# Patient Record
Sex: Female | Born: 1980 | Race: White | Hispanic: No | Marital: Married | Smoking: Never smoker
Health system: Southern US, Community
[De-identification: ages and names within clinical notes are randomized; demographics above are authoritative.]

## PROBLEM LIST (undated history)

## (undated) ENCOUNTER — Inpatient Hospital Stay (HOSPITAL_COMMUNITY): Payer: Self-pay

## (undated) DIAGNOSIS — N72 Inflammatory disease of cervix uteri: Secondary | ICD-10-CM

## (undated) DIAGNOSIS — K589 Irritable bowel syndrome without diarrhea: Secondary | ICD-10-CM

## (undated) DIAGNOSIS — N760 Acute vaginitis: Secondary | ICD-10-CM

## (undated) DIAGNOSIS — R1032 Left lower quadrant pain: Secondary | ICD-10-CM

## (undated) DIAGNOSIS — Z975 Presence of (intrauterine) contraceptive device: Secondary | ICD-10-CM

## (undated) DIAGNOSIS — I459 Conduction disorder, unspecified: Secondary | ICD-10-CM

## (undated) DIAGNOSIS — N926 Irregular menstruation, unspecified: Secondary | ICD-10-CM

## (undated) DIAGNOSIS — B9689 Other specified bacterial agents as the cause of diseases classified elsewhere: Secondary | ICD-10-CM

## (undated) DIAGNOSIS — F99 Mental disorder, not otherwise specified: Secondary | ICD-10-CM

## (undated) DIAGNOSIS — F419 Anxiety disorder, unspecified: Secondary | ICD-10-CM

## (undated) DIAGNOSIS — G43909 Migraine, unspecified, not intractable, without status migrainosus: Secondary | ICD-10-CM

## (undated) DIAGNOSIS — J329 Chronic sinusitis, unspecified: Secondary | ICD-10-CM

## (undated) DIAGNOSIS — N898 Other specified noninflammatory disorders of vagina: Secondary | ICD-10-CM

## (undated) DIAGNOSIS — IMO0002 Reserved for concepts with insufficient information to code with codable children: Secondary | ICD-10-CM

## (undated) HISTORY — DX: Mental disorder, not otherwise specified: F99

## (undated) HISTORY — DX: Left lower quadrant pain: R10.32

## (undated) HISTORY — DX: Anxiety disorder, unspecified: F41.9

## (undated) HISTORY — DX: Other specified noninflammatory disorders of vagina: N89.8

## (undated) HISTORY — DX: Presence of (intrauterine) contraceptive device: Z97.5

## (undated) HISTORY — DX: Other specified bacterial agents as the cause of diseases classified elsewhere: B96.89

## (undated) HISTORY — DX: Irregular menstruation, unspecified: N92.6

## (undated) HISTORY — DX: Acute vaginitis: N76.0

## (undated) HISTORY — DX: Chronic sinusitis, unspecified: J32.9

## (undated) HISTORY — DX: Inflammatory disease of cervix uteri: N72

## (undated) HISTORY — DX: Irritable bowel syndrome, unspecified: K58.9

## (undated) HISTORY — DX: Reserved for concepts with insufficient information to code with codable children: IMO0002

---
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
## 2005-09-06 ENCOUNTER — Other Ambulatory Visit: Admission: RE | Admit: 2005-09-06 | Discharge: 2005-09-06 | Payer: Self-pay | Admitting: Gynecology

## 2005-11-18 ENCOUNTER — Other Ambulatory Visit: Admission: RE | Admit: 2005-11-18 | Discharge: 2005-11-18 | Payer: Self-pay | Admitting: Gynecology

## 2006-07-06 ENCOUNTER — Other Ambulatory Visit: Admission: RE | Admit: 2006-07-06 | Discharge: 2006-07-06 | Payer: Self-pay | Admitting: Gynecology

## 2007-03-07 ENCOUNTER — Other Ambulatory Visit: Admission: RE | Admit: 2007-03-07 | Discharge: 2007-03-07 | Payer: Self-pay | Admitting: Gynecology

## 2007-06-01 ENCOUNTER — Ambulatory Visit (HOSPITAL_COMMUNITY): Admission: RE | Admit: 2007-06-01 | Discharge: 2007-06-01 | Payer: Self-pay | Admitting: Family Medicine

## 2009-03-01 DIAGNOSIS — IMO0002 Reserved for concepts with insufficient information to code with codable children: Secondary | ICD-10-CM

## 2009-03-01 DIAGNOSIS — R87619 Unspecified abnormal cytological findings in specimens from cervix uteri: Secondary | ICD-10-CM

## 2009-03-01 HISTORY — DX: Unspecified abnormal cytological findings in specimens from cervix uteri: R87.619

## 2009-03-01 HISTORY — DX: Reserved for concepts with insufficient information to code with codable children: IMO0002

## 2009-07-02 ENCOUNTER — Other Ambulatory Visit: Admission: RE | Admit: 2009-07-02 | Discharge: 2009-07-02 | Payer: Self-pay | Admitting: Obstetrics and Gynecology

## 2010-05-11 ENCOUNTER — Inpatient Hospital Stay (HOSPITAL_COMMUNITY)
Admission: AD | Admit: 2010-05-11 | Discharge: 2010-05-14 | DRG: 373 | Disposition: A | Discharge status: Home / Self Care | Payer: BC Managed Care – PPO | Source: Ambulatory Visit | Attending: Family Medicine | Admitting: Family Medicine

## 2010-05-11 DIAGNOSIS — O878 Other venous complications in the puerperium: Secondary | ICD-10-CM | POA: Diagnosis present

## 2010-05-11 DIAGNOSIS — K649 Unspecified hemorrhoids: Secondary | ICD-10-CM | POA: Diagnosis present

## 2010-05-12 DIAGNOSIS — O878 Other venous complications in the puerperium: Secondary | ICD-10-CM

## 2010-05-12 DIAGNOSIS — K649 Unspecified hemorrhoids: Secondary | ICD-10-CM

## 2010-05-12 LAB — URINALYSIS, DIPSTICK ONLY
Bilirubin Urine: NEGATIVE
Glucose, UA: NEGATIVE mg/dL
Ketones, ur: 15 mg/dL — AB
Leukocytes, UA: NEGATIVE
Nitrite: NEGATIVE
Protein, ur: NEGATIVE mg/dL
Specific Gravity, Urine: 1.015 (ref 1.005–1.030)
Urobilinogen, UA: 0.2 mg/dL (ref 0.0–1.0)
pH: 7 (ref 5.0–8.0)

## 2010-05-12 LAB — CBC
HCT: 33.8 % — ABNORMAL LOW (ref 36.0–46.0)
Hemoglobin: 10.6 g/dL — ABNORMAL LOW (ref 12.0–15.0)
MCH: 26.1 pg (ref 26.0–34.0)
MCHC: 31.4 g/dL (ref 30.0–36.0)
MCV: 83.3 fL (ref 78.0–100.0)
Platelets: 193 10*3/uL (ref 150–400)
RBC: 4.06 MIL/uL (ref 3.87–5.11)
RDW: 14.9 % (ref 11.5–15.5)
WBC: 11.1 10*3/uL — ABNORMAL HIGH (ref 4.0–10.5)

## 2010-05-12 LAB — COMPREHENSIVE METABOLIC PANEL
ALT: 11 U/L (ref 0–35)
AST: 22 U/L (ref 0–37)
Albumin: 2.4 g/dL — ABNORMAL LOW (ref 3.5–5.2)
Alkaline Phosphatase: 134 U/L — ABNORMAL HIGH (ref 39–117)
BUN: 3 mg/dL — ABNORMAL LOW (ref 6–23)
CO2: 24 mEq/L (ref 19–32)
Calcium: 8.6 mg/dL (ref 8.4–10.5)
Chloride: 107 mEq/L (ref 96–112)
Creatinine, Ser: 0.62 mg/dL (ref 0.4–1.2)
GFR calc Af Amer: >60 mL/min (ref >60)
GFR calc non Af Amer: >60 mL/min (ref >60)
Glucose, Bld: 82 mg/dL (ref 70–99)
Potassium: 3.5 mEq/L (ref 3.5–5.1)
Sodium: 137 mEq/L (ref 135–145)
Total Bilirubin: 0.5 mg/dL (ref 0.3–1.2)
Total Protein: 5.6 g/dL — ABNORMAL LOW (ref 6.0–8.3)

## 2010-05-12 LAB — RPR: RPR Ser Ql: NONREACTIVE

## 2010-05-22 ENCOUNTER — Inpatient Hospital Stay (HOSPITAL_COMMUNITY): Admission: AD | Admit: 2010-05-22 | Payer: Self-pay | Source: Home / Self Care | Admitting: Family Medicine

## 2010-10-12 ENCOUNTER — Other Ambulatory Visit (HOSPITAL_COMMUNITY)
Admission: RE | Admit: 2010-10-12 | Discharge: 2010-10-12 | Disposition: A | Discharge status: Home / Self Care | Payer: BC Managed Care – PPO | Source: Ambulatory Visit | Attending: Obstetrics and Gynecology | Admitting: Obstetrics and Gynecology

## 2010-10-12 DIAGNOSIS — Z01419 Encounter for gynecological examination (general) (routine) without abnormal findings: Secondary | ICD-10-CM | POA: Insufficient documentation

## 2012-02-07 LAB — OB RESULTS CONSOLE RPR: RPR: NONREACTIVE

## 2012-02-07 LAB — OB RESULTS CONSOLE VARICELLA ZOSTER ANTIBODY, IGG: Varicella: IMMUNE

## 2012-02-07 LAB — OB RESULTS CONSOLE ABO/RH

## 2012-02-07 LAB — OB RESULTS CONSOLE ANTIBODY SCREEN: Antibody Screen: NEGATIVE

## 2012-03-01 NOTE — L&D Delivery Note (Signed)
Delivery Note At 2:14 PM a viable and healthy female was delivered via Vaginal, Spontaneous Delivery (Presentation: ; Occiput Posterior).  APGAR: 9, 9; weight pending.   Placenta status: Intact, Spontaneous.  Cord: 3 vessels with the following complications: None.    Anesthesia: Epidural  Episiotomy: None Lacerations: 2nd degree;Perineal Suture Repair: 2.0 and 3.0 vicryl Est. Blood Loss (mL): 300  Mom to postpartum.  Baby to nursery-stable.  Tawni Carnes 09/28/2012, 3:06 PM

## 2012-04-25 ENCOUNTER — Encounter: Payer: Self-pay | Admitting: *Deleted

## 2012-05-16 ENCOUNTER — Encounter: Payer: Self-pay | Admitting: Adult Health

## 2012-05-18 ENCOUNTER — Encounter: Payer: Self-pay | Admitting: Adult Health

## 2012-05-18 ENCOUNTER — Ambulatory Visit (INDEPENDENT_AMBULATORY_CARE_PROVIDER_SITE_OTHER): Payer: BC Managed Care – PPO | Admitting: Adult Health

## 2012-05-18 VITALS — BP 120/80

## 2012-05-18 DIAGNOSIS — J329 Chronic sinusitis, unspecified: Secondary | ICD-10-CM | POA: Insufficient documentation

## 2012-05-18 HISTORY — DX: Chronic sinusitis, unspecified: J32.9

## 2012-05-18 LAB — POCT URINALYSIS DIPSTICK
Bilirubin, UA: NEGATIVE
Glucose, UA: NEGATIVE
Leukocytes, UA: NEGATIVE
Nitrite, UA: NEGATIVE
Urobilinogen, UA: NEGATIVE

## 2012-05-18 MED ORDER — CEPHALEXIN 500 MG PO CAPS: 500.0000 mg | ORAL_CAPSULE | Freq: Four times a day (QID) | ORAL | Status: DC

## 2012-05-18 NOTE — Progress Notes (Signed)
Pt has pressure in head and teeth hurt, has used netty pot.On exam lungs clear +maxillary and ethmoid tenderness. Will prescribe Keflex 500mg  1 daily x 7 days, ok to continue with netty pot, and warm showers.

## 2012-05-18 NOTE — Progress Notes (Signed)
Here today for problems with her sinuses.

## 2012-05-18 NOTE — Patient Instructions (Addendum)
Prescribed keflex 500mg  1 4 x daily x 7 days, ok to continue with netty pot  Follow up as scheduled

## 2012-05-23 ENCOUNTER — Encounter: Payer: Self-pay | Admitting: Advanced Practice Midwife

## 2012-06-08 ENCOUNTER — Ambulatory Visit (INDEPENDENT_AMBULATORY_CARE_PROVIDER_SITE_OTHER): Payer: BC Managed Care – PPO | Admitting: Obstetrics & Gynecology

## 2012-06-08 ENCOUNTER — Encounter: Payer: Self-pay | Admitting: Obstetrics & Gynecology

## 2012-06-08 VITALS — BP 110/70 | Wt 161.0 lb

## 2012-06-08 DIAGNOSIS — Z3482 Encounter for supervision of other normal pregnancy, second trimester: Secondary | ICD-10-CM

## 2012-06-08 DIAGNOSIS — Z3480 Encounter for supervision of other normal pregnancy, unspecified trimester: Secondary | ICD-10-CM

## 2012-06-08 LAB — POCT URINALYSIS DIPSTICK
Ketones, UA: NEGATIVE
Leukocytes, UA: NEGATIVE

## 2012-06-08 NOTE — Patient Instructions (Signed)
Don't forget the sugar test Next time.   Pregnancy - Second Trimester The second trimester of pregnancy (3 to 6 months) is a period of rapid growth for you and your baby. At the end of the sixth month, your baby is about 9 inches long and weighs 1 1/2 pounds. You will begin to feel the baby move between 18 and 20 weeks of the pregnancy. This is called quickening. Weight gain is faster. A clear fluid (colostrum) may leak out of your breasts. You may feel small contractions of the womb (uterus). This is known as false labor or Braxton-Hicks contractions. This is like a practice for labor when the baby is ready to be born. Usually, the problems with morning sickness have usually passed by the end of your first trimester. Some women develop small dark blotches (called cholasma, mask of pregnancy) on their face that usually goes away after the baby is born. Exposure to the sun makes the blotches worse. Acne may also develop in some pregnant women and pregnant women who have acne, may find that it goes away. PRENATAL EXAMS  Blood work may continue to be done during prenatal exams. These tests are done to check on your health and the probable health of your baby. Blood work is used to follow your blood levels (hemoglobin). Anemia (low hemoglobin) is common during pregnancy. Iron and vitamins are given to help prevent this. You will also be checked for diabetes between 24 and 28 weeks of the pregnancy. Some of the previous blood tests may be repeated.  The size of the uterus is measured during each visit. This is to make sure that the baby is continuing to grow properly according to the dates of the pregnancy.  Your blood pressure is checked every prenatal visit. This is to make sure you are not getting toxemia.  Your urine is checked to make sure you do not have an infection, diabetes or protein in the urine.  Your weight is checked often to make sure gains are happening at the suggested rate. This is to  ensure that both you and your baby are growing normally.  Sometimes, an ultrasound is performed to confirm the proper growth and development of the baby. This is a test which bounces harmless sound waves off the baby so your caregiver can more accurately determine due dates. Sometimes, a specialized test is done on the amniotic fluid surrounding the baby. This test is called an amniocentesis. The amniotic fluid is obtained by sticking a needle into the belly (abdomen). This is done to check the chromosomes in instances where there is a concern about possible genetic problems with the baby. It is also sometimes done near the end of pregnancy if an early delivery is required. In this case, it is done to help make sure the baby's lungs are mature enough for the baby to live outside of the womb. CHANGES OCCURING IN THE SECOND TRIMESTER OF PREGNANCY Your body goes through many changes during pregnancy. They vary from person to person. Talk to your caregiver about changes you notice that you are concerned about.  During the second trimester, you will likely have an increase in your appetite. It is normal to have cravings for certain foods. This varies from person to person and pregnancy to pregnancy.  Your lower abdomen will begin to bulge.  You may have to urinate more often because the uterus and baby are pressing on your bladder. It is also common to get more bladder infections during  pregnancy (pain with urination). You can help this by drinking lots of fluids and emptying your bladder before and after intercourse.  You may begin to get stretch marks on your hips, abdomen, and breasts. These are normal changes in the body during pregnancy. There are no exercises or medications to take that prevent this change.  You may begin to develop swollen and bulging veins (varicose veins) in your legs. Wearing support hose, elevating your feet for 15 minutes, 3 to 4 times a day and limiting salt in your diet helps  lessen the problem.  Heartburn may develop as the uterus grows and pushes up against the stomach. Antacids recommended by your caregiver helps with this problem. Also, eating smaller meals 4 to 5 times a day helps.  Constipation can be treated with a stool softener or adding bulk to your diet. Drinking lots of fluids, vegetables, fruits, and whole grains are helpful.  Exercising is also helpful. If you have been very active up until your pregnancy, most of these activities can be continued during your pregnancy. If you have been less active, it is helpful to start an exercise program such as walking.  Hemorrhoids (varicose veins in the rectum) may develop at the end of the second trimester. Warm sitz baths and hemorrhoid cream recommended by your caregiver helps hemorrhoid problems.  Backaches may develop during this time of your pregnancy. Avoid heavy lifting, wear low heal shoes and practice good posture to help with backache problems.  Some pregnant women develop tingling and numbness of their hand and fingers because of swelling and tightening of ligaments in the wrist (carpel tunnel syndrome). This goes away after the baby is born.  As your breasts enlarge, you may have to get a bigger bra. Get a comfortable, cotton, support bra. Do not get a nursing bra until the last month of the pregnancy if you will be nursing the baby.  You may get a dark line from your belly button to the pubic area called the linea nigra.  You may develop rosy cheeks because of increase blood flow to the face.  You may develop spider looking lines of the face, neck, arms and chest. These go away after the baby is born. HOME CARE INSTRUCTIONS   It is extremely important to avoid all smoking, herbs, alcohol, and unprescribed drugs during your pregnancy. These chemicals affect the formation and growth of the baby. Avoid these chemicals throughout the pregnancy to ensure the delivery of a healthy infant.  Most of  your home care instructions are the same as suggested for the first trimester of your pregnancy. Keep your caregiver's appointments. Follow your caregiver's instructions regarding medication use, exercise and diet.  During pregnancy, you are providing food for you and your baby. Continue to eat regular, well-balanced meals. Choose foods such as meat, fish, milk and other low fat dairy products, vegetables, fruits, and whole-grain breads and cereals. Your caregiver will tell you of the ideal weight gain.  A physical sexual relationship may be continued up until near the end of pregnancy if there are no other problems. Problems could include early (premature) leaking of amniotic fluid from the membranes, vaginal bleeding, abdominal pain, or other medical or pregnancy problems.  Exercise regularly if there are no restrictions. Check with your caregiver if you are unsure of the safety of some of your exercises. The greatest weight gain will occur in the last 2 trimesters of pregnancy. Exercise will help you:  Control your weight.  Get you  in shape for labor and delivery.  Lose weight after you have the baby.  Wear a good support or jogging bra for breast tenderness during pregnancy. This may help if worn during sleep. Pads or tissues may be used in the bra if you are leaking colostrum.  Do not use hot tubs, steam rooms or saunas throughout the pregnancy.  Wear your seat belt at all times when driving. This protects you and your baby if you are in an accident.  Avoid raw meat, uncooked cheese, cat litter boxes and soil used by cats. These carry germs that can cause birth defects in the baby.  The second trimester is also a good time to visit your dentist for your dental health if this has not been done yet. Getting your teeth cleaned is OK. Use a soft toothbrush. Brush gently during pregnancy.  It is easier to loose urine during pregnancy. Tightening up and strengthening the pelvic muscles will  help with this problem. Practice stopping your urination while you are going to the bathroom. These are the same muscles you need to strengthen. It is also the muscles you would use as if you were trying to stop from passing gas. You can practice tightening these muscles up 10 times a set and repeating this about 3 times per day. Once you know what muscles to tighten up, do not perform these exercises during urination. It is more likely to contribute to an infection by backing up the urine.  Ask for help if you have financial, counseling or nutritional needs during pregnancy. Your caregiver will be able to offer counseling for these needs as well as refer you for other special needs.  Your skin may become oily. If so, wash your face with mild soap, use non-greasy moisturizer and oil or cream based makeup. MEDICATIONS AND DRUG USE IN PREGNANCY  Take prenatal vitamins as directed. The vitamin should contain 1 milligram of folic acid. Keep all vitamins out of reach of children. Only a couple vitamins or tablets containing iron may be fatal to a baby or young child when ingested.  Avoid use of all medications, including herbs, over-the-counter medications, not prescribed or suggested by your caregiver. Only take over-the-counter or prescription medicines for pain, discomfort, or fever as directed by your caregiver. Do not use aspirin.  Let your caregiver also know about herbs you may be using.  Alcohol is related to a number of birth defects. This includes fetal alcohol syndrome. All alcohol, in any form, should be avoided completely. Smoking will cause low birth rate and premature babies.  Street or illegal drugs are very harmful to the baby. They are absolutely forbidden. A baby born to an addicted mother will be addicted at birth. The baby will go through the same withdrawal an adult does. SEEK MEDICAL CARE IF:  You have any concerns or worries during your pregnancy. It is better to call with your  questions if you feel they cannot wait, rather than worry about them. SEEK IMMEDIATE MEDICAL CARE IF:   An unexplained oral temperature above 102 F (38.9 C) develops, or as your caregiver suggests.  You have leaking of fluid from the vagina (birth canal). If leaking membranes are suspected, take your temperature and tell your caregiver of this when you call.  There is vaginal spotting, bleeding, or passing clots. Tell your caregiver of the amount and how many pads are used. Light spotting in pregnancy is common, especially following intercourse.  You develop a bad smelling vaginal  discharge with a change in the color from clear to white.  You continue to feel sick to your stomach (nauseated) and have no relief from remedies suggested. You vomit blood or coffee ground-like materials.  You lose more than 2 pounds of weight or gain more than 2 pounds of weight over 1 week, or as suggested by your caregiver.  You notice swelling of your face, hands, feet, or legs.  You get exposed to [place] measles and have never had them.  You are exposed to fifth disease or chickenpox.  You develop belly (abdominal) pain. Round ligament discomfort is a common non-cancerous (benign) cause of abdominal pain in pregnancy. Your caregiver still must evaluate you.  You develop a bad headache that does not go away.  You develop fever, diarrhea, pain with urination, or shortness of breath.  You develop visual problems, blurry, or double vision.  You fall or are in a car accident or any kind of trauma.  There is mental or physical violence at home. Document Released: 02/09/2001 Document Revised: 05/10/2011 Document Reviewed: 08/14/2008 [facility] Patient Information 2013 [place], [place].

## 2012-06-08 NOTE — Progress Notes (Signed)
BP weight and urine results all reviewed and noted. Patient reports good fetal movement, denies any bleeding and no rupture of membranes symptoms or regular contractions. Patient is without complaints. All questions were answered.  

## 2012-06-19 ENCOUNTER — Telehealth: Payer: Self-pay | Admitting: Obstetrics and Gynecology

## 2012-06-19 NOTE — Telephone Encounter (Signed)
Spoke with pt. Was having Braxton Hicks contractions on Friday, Sat, and Sunday. Notices it more when laying down. No bleeding. +baby movement. Advised to keep a close check on it. If they come again and are timeable, push fluids, lay down, and call us if it's during office hours. If it's after office hours, call the after hour nurse line. Next scheduled appt here is May 4. Pt is a runner, so I advised to lay off on running for a few days. Pt voiced understanding.

## 2012-07-06 ENCOUNTER — Other Ambulatory Visit: Payer: BC Managed Care – PPO

## 2012-07-06 ENCOUNTER — Encounter: Payer: Self-pay | Admitting: Advanced Practice Midwife

## 2012-07-06 ENCOUNTER — Ambulatory Visit (INDEPENDENT_AMBULATORY_CARE_PROVIDER_SITE_OTHER): Payer: BC Managed Care – PPO | Admitting: Advanced Practice Midwife

## 2012-07-06 VITALS — BP 120/72 | Wt 167.5 lb

## 2012-07-06 DIAGNOSIS — Z3482 Encounter for supervision of other normal pregnancy, second trimester: Secondary | ICD-10-CM

## 2012-07-06 DIAGNOSIS — Z3480 Encounter for supervision of other normal pregnancy, unspecified trimester: Secondary | ICD-10-CM

## 2012-07-06 LAB — CBC
HCT: 37.4 % (ref 36.0–46.0)
Hemoglobin: 12.6 g/dL (ref 12.0–15.0)
MCHC: 33.7 g/dL (ref 30.0–36.0)
MCV: 90.1 fL (ref 78.0–100.0)
WBC: 7.9 10*3/uL (ref 4.0–10.5)

## 2012-07-06 LAB — POCT URINALYSIS DIPSTICK: Protein, UA: NEGATIVE

## 2012-07-06 LAB — RPR

## 2012-07-06 NOTE — Progress Notes (Signed)
Braxton Hicks contractions; pressure, baby seems low.

## 2012-07-06 NOTE — Assessment & Plan Note (Signed)
Clinic:[facility]  Genetic Screen NT:                                        Quad Screen/MSAFP:  Anatomic US   Glucose Screen   GBS   Feeding Preference   Contraception   Circumcision     

## 2012-07-06 NOTE — Patient Instructions (Signed)
Braxton Hicks Contractions  Pregnancy is commonly associated with contractions of the uterus throughout the pregnancy. Towards the end of pregnancy (32 to 34 weeks), these contractions (Braxton Hicks) can develop more often and may become more forceful. This is not true labor because these contractions do not result in opening (dilatation) and thinning of the cervix. They are sometimes difficult to tell apart from true labor because these contractions can be forceful and people have different pain tolerances. You should not feel embarrassed if you go to the hospital with false labor. Sometimes, the only way to tell if you are in true labor is for your caregiver to follow the changes in the cervix.  How to tell the difference between true and false labor:  · False labor.  · The contractions of false labor are usually shorter, irregular and not as hard as those of true labor.  · They are often felt in the front of the lower abdomen and in the groin.  · They may leave with walking around or changing positions while lying down.  · They get weaker and are shorter lasting as time goes on.  · These contractions are usually irregular.  · They do not usually become progressively stronger, regular and closer together as with true labor.  · True labor.  · Contractions in true labor last 30 to 70 seconds, become very regular, usually become more intense, and increase in frequency.  · They do not go away with walking.  · The discomfort is usually felt in the top of the uterus and spreads to the lower abdomen and low back.  · True labor can be determined by your caregiver with an exam. This will show that the cervix is dilating and getting thinner.  If there are no prenatal problems or other health problems associated with the pregnancy, it is completely safe to be sent home with false labor and await the onset of true labor.  HOME CARE INSTRUCTIONS   · Keep up with your usual exercises and instructions.  · Take medications as  directed.  · Keep your regular prenatal appointment.  · Eat and drink lightly if you think you are going into labor.  · If BH contractions are making you uncomfortable:  · Change your activity position from lying down or resting to walking/walking to resting.  · Sit and rest in a tub of warm water.  · Drink 2 to 3 glasses of water. Dehydration may cause B-H contractions.  · Do slow and deep breathing several times an hour.  SEEK IMMEDIATE MEDICAL CARE IF:   · Your contractions continue to become stronger, more regular, and closer together.  · You have a gushing, burst or leaking of fluid from the vagina.  · An oral temperature above 102° F (38.9° C) develops.  · You have passage of blood-tinged mucus.  · You develop vaginal bleeding.  · You develop continuous belly (abdominal) pain.  · You have low back pain that you never had before.  · You feel the baby's head pushing down causing pelvic pressure.  · The baby is not moving as much as it used to.  Document Released: 02/15/2005 Document Revised: 05/10/2011 Document Reviewed: 08/09/2008  [employer]® Patient Information ©2013 [employer].

## 2012-07-06 NOTE — Progress Notes (Signed)
Here for PN2;   Having "a lot of braxton hicks "  For about 2-3 weeks.  Epidsodes last 2-3 hours, and they are "back to back" and feels like baby is very low.   No concerns with vaginal exam.  No fetal parts in pelvis.  fFn collected.  Routine questions about pregnancy answered.  F/U in 4 weeks for LROB.

## 2012-07-07 LAB — GLUCOSE TOLERANCE, 2 HOURS W/ 1HR
Glucose, 2 hour: 105 mg/dL (ref 70–139)
Glucose, Fasting: 69 mg/dL — ABNORMAL LOW (ref 70–99)

## 2012-07-07 LAB — HSV 2 ANTIBODY, IGG: HSV 2 Glycoprotein G Ab, IgG: <0.1 IV

## 2012-07-07 LAB — ANTIBODY SCREEN: Antibody Screen: NEGATIVE

## 2012-07-17 ENCOUNTER — Telehealth: Payer: Self-pay | Admitting: *Deleted

## 2012-07-18 ENCOUNTER — Telehealth: Payer: Self-pay | Admitting: Obstetrics and Gynecology

## 2012-07-18 NOTE — Telephone Encounter (Signed)
Pt informed of WNL Glucose results and Neg- fetal fibronectin, pt states having Braxton Hicks contractions, pt encouraged to push fluids and rest and can take tylenol anytime during pregnancy, pt verbalized understanding.

## 2012-08-03 ENCOUNTER — Ambulatory Visit (INDEPENDENT_AMBULATORY_CARE_PROVIDER_SITE_OTHER): Payer: BC Managed Care – PPO | Admitting: Obstetrics & Gynecology

## 2012-08-03 ENCOUNTER — Encounter: Payer: Self-pay | Admitting: Obstetrics & Gynecology

## 2012-08-03 VITALS — BP 110/60 | Wt 171.0 lb

## 2012-08-03 DIAGNOSIS — Z331 Pregnant state, incidental: Secondary | ICD-10-CM

## 2012-08-03 DIAGNOSIS — Z1389 Encounter for screening for other disorder: Secondary | ICD-10-CM

## 2012-08-03 DIAGNOSIS — Z3480 Encounter for supervision of other normal pregnancy, unspecified trimester: Secondary | ICD-10-CM

## 2012-08-03 LAB — POCT URINALYSIS DIPSTICK
Ketones, UA: NEGATIVE
Protein, UA: NEGATIVE

## 2012-08-03 NOTE — Progress Notes (Signed)
BP weight and urine results all reviewed and noted. Patient reports good fetal movement, denies any bleeding and no rupture of membranes symptoms or regular contractions. Patient is without complaints. All questions were answered.  

## 2012-08-14 NOTE — Telephone Encounter (Signed)
Done

## 2012-08-17 ENCOUNTER — Ambulatory Visit (INDEPENDENT_AMBULATORY_CARE_PROVIDER_SITE_OTHER): Payer: BC Managed Care – PPO | Admitting: Advanced Practice Midwife

## 2012-08-17 ENCOUNTER — Encounter: Payer: Self-pay | Admitting: Advanced Practice Midwife

## 2012-08-17 VITALS — BP 120/80 | Wt 171.6 lb

## 2012-08-17 DIAGNOSIS — Z331 Pregnant state, incidental: Secondary | ICD-10-CM

## 2012-08-17 DIAGNOSIS — Z1389 Encounter for screening for other disorder: Secondary | ICD-10-CM

## 2012-08-17 DIAGNOSIS — Z3480 Encounter for supervision of other normal pregnancy, unspecified trimester: Secondary | ICD-10-CM

## 2012-08-17 LAB — POCT URINALYSIS DIPSTICK: Ketones, UA: NEGATIVE

## 2012-08-17 NOTE — Progress Notes (Signed)
No c/o at this time.out of work [employer]) until January.  Routine questions about pregnancy answered.  F/U in 2 weeks for LROB.

## 2012-08-30 ENCOUNTER — Encounter: Payer: Self-pay | Admitting: Obstetrics & Gynecology

## 2012-08-30 ENCOUNTER — Ambulatory Visit (INDEPENDENT_AMBULATORY_CARE_PROVIDER_SITE_OTHER): Payer: BC Managed Care – PPO | Admitting: Obstetrics & Gynecology

## 2012-08-30 VITALS — BP 120/80 | Wt 176.0 lb

## 2012-08-30 DIAGNOSIS — Z1389 Encounter for screening for other disorder: Secondary | ICD-10-CM

## 2012-08-30 DIAGNOSIS — Z3483 Encounter for supervision of other normal pregnancy, third trimester: Secondary | ICD-10-CM

## 2012-08-30 DIAGNOSIS — Z331 Pregnant state, incidental: Secondary | ICD-10-CM

## 2012-08-30 DIAGNOSIS — Z3480 Encounter for supervision of other normal pregnancy, unspecified trimester: Secondary | ICD-10-CM

## 2012-08-30 LAB — POCT URINALYSIS DIPSTICK
Glucose, UA: NEGATIVE
Nitrite, UA: NEGATIVE

## 2012-08-30 NOTE — Progress Notes (Signed)
BP weight and urine results all reviewed and noted. Patient reports good fetal movement, denies any bleeding and no rupture of membranes symptoms or regular contractions. Patient is without complaints. All questions were answered. Taking zantac prn wants to continue otc

## 2012-08-30 NOTE — Patient Instructions (Signed)

## 2012-09-15 ENCOUNTER — Ambulatory Visit (INDEPENDENT_AMBULATORY_CARE_PROVIDER_SITE_OTHER): Payer: BC Managed Care – PPO | Admitting: Obstetrics & Gynecology

## 2012-09-15 ENCOUNTER — Encounter: Payer: Self-pay | Admitting: Obstetrics & Gynecology

## 2012-09-15 VITALS — BP 120/80 | Wt 177.0 lb

## 2012-09-15 DIAGNOSIS — Z331 Pregnant state, incidental: Secondary | ICD-10-CM

## 2012-09-15 DIAGNOSIS — Z3483 Encounter for supervision of other normal pregnancy, third trimester: Secondary | ICD-10-CM

## 2012-09-15 DIAGNOSIS — Z1389 Encounter for screening for other disorder: Secondary | ICD-10-CM

## 2012-09-15 DIAGNOSIS — Z3480 Encounter for supervision of other normal pregnancy, unspecified trimester: Secondary | ICD-10-CM

## 2012-09-15 LAB — POCT URINALYSIS DIPSTICK
Glucose, UA: NEGATIVE
Nitrite, UA: NEGATIVE

## 2012-09-15 NOTE — Progress Notes (Signed)
FOR GBS/GC/CHL TODAY. 

## 2012-09-15 NOTE — Patient Instructions (Signed)
Epidural Risks and Benefits The continuous putting in (infusion) of local anesthetics through a long, narrow, hollow plastic tube (catheter)/needle into the lower (lumbar) area of your spine is commonly called an epidural. This means outside the covering of the spinal cord. The epidural catheter is placed in the space on the outside of the membrane that covers the spinal cord. The anesthetic medicine numbs the nerves of the spinal cord in the epidural space. There is also a spinal/epidural anesthetic using two needles and a catheter. The medication is first placed in the spinal canal. Then that needle is removed and a catheter is placed in the epidural space through the second needle for continuous anesthesia. This seems to be the most popular type of regional anesthesia used now. This is sometimes given for pain management to women who are giving birth. Spinal and epidural anesthesia are called regional anesthesia because they numb a certain region of the body. While it is an effective pain management tool, some reasons not to use this include:  Restricted mobility: The tubes and monitors connected to you do not allow for much moving around.  Increased likelihood of bladder catheterization, oxytocin administration, and internal monitoring. This means a tube (catheter) may have to be put into the bladder to drain the urine. Uterine contractions can become weaker and less frequent. They also may have a higher use of oxytocin than mothers not having regional anesthesia.  Increased likelihood of operative delivery: This includes the use of or need for forceps, vacuum extractor, episiotomy, or cesarean delivery. When the dose is too large, or when it sinks down into the "tailbone" (sacral) region of the body, the perineum and the birth canal (vagina) are anesthetized. Anesthetic is injected into this area late in labor to deaden all sensation. When it "accidentally" happens earlier in labor, the muscles of the  pelvic floor are relaxed too early. This interferes with the normal flexion and rotation of the baby's head as it passes through the birth canal. This interference can lead to abnormal presentations that are more dangerous for the baby.  Must use an automatic blood pressure cuff throughout labor. This is a cuff that automatically takes your blood pressure at regular intervals. SHORT TERM MATERNAL RISKS  Dural puncture - The dura is one of the membranes surrounding the spinal cord. If the anesthetic medication gets into the spinal canal through a dural puncture, it can result in a spinal anesthetic and spinal headache. Spinal headaches are treated with an epidural blood patch to cover the punctured area.  Low blood pressure (hypotension) - Nearly one third of women with an epidural will develop low blood pressure. The ways that patients must lay during the epidural can make this worse. Their position is limited because they will be unable to move their legs easily for the time of the anesthetic. Low blood pressure is also a risk for the baby. If the baby does not get enough oxygen from the mom's blood, it can result in an emergency Cesarean section. This means the baby is delivered by an operation through a cut by the surgeon (incision) on the belly of the mother.  Nausea, vomiting, and prolonged shivering.  Prolonged labor - With large doses of anesthetic medication, the patient loses the desire and the ability to bear down and push. This results in an increased use of forceps and vacuum extractions, compared to women having unmedicated deliveries.  Uneven, incomplete or non-existent pain relief. Sometimes the epidural does not work well and   additional medications may be needed for pain relief.  Difficulty breathing well or paralysis if the level of anesthesia goes too high in the spine.  Convulsions - If the anesthetic agent accidentally is injected into a blood vessel it can cause convulsions and  loss of consciousness.  Toxic drug reactions.  Septic meningitis - An abscess can form at the site where the epidural catheter is placed. If this spreads into the spinal canal it can cause meningitis.  Allergic reaction - This causes blood pressure to become too low and other medications and fluids must be given to bring the blood pressure up. Also rashes and difficulty breathing may develop.  Cardiac arrest - This is rare but real threat to the life of the mother and baby.  Fever is common.  Itching that is easily treated.  Spinal hematoma. LONG TERM MATERNAL RISKS  Neurological complications - A nerve problem called Horner's syndrome can develop with epidural anesthesia for vaginal delivery. It is impossible to predict which patients will develop a Horner's syndrome. Even the nerves to the face can be blocked, temporarily or permanently. Tremors and shakes can occur.  Paresthesia ("pins and needles"). This is a feeling that comes from inflammation of a nerve.  Dizziness and fainting can become a problem after epidurals. This is usually only for a couple of days. RISKS TO BABY  Direct drug toxicity.  Fetal distress, abnormal fetal heart rate (FHR) (can lead to emergency cesarean). This is especially true if the anesthetic gets into the mother's blood stream or too much medication is put into the epidural. REASONS NOT TO HAVE EPIDURAL ANESTHESIA  Increased costs.  The mother has a low blood pressure.  There are blood clotting problems.  A brain tumor is present.  There is an infection in the blood stream.  A skin infection at the needle site.  A tattoo at the needle site. BENEFITS  Regional anesthesia is the most effective pain relief for labor and delivery.  It is the best anesthetic for preeclampsia and eclampsia.  There is better pain control after delivery (vaginal or cesarean).  When done correctly, no medication gets to the baby.  Sooner ambulation after  delivery.  It can be left in place during all of labor.  You can be awake during a Cesarean delivery and see the baby immediately after delivery. AFTER THE PROCEDURE   You will be kept in bed for several hours to prevent headaches.  You will be kept in bed until your legs are no longer numb and it is safe to walk.  The length of time you spend in the hospital will depend on the type of surgery or procedure you have had.  The epidural catheter is removed after you no longer need it for pain. HOME CARE INSTRUCTIONS   Do not drive or operate any kind of machinery for at least 24 hours. Make sure there is someone to drive you home.  Do not drink alcohol for at least 24 hours after the anesthesia.  Do not make important decisions for at least 24 hours after the anesthesia.  Drink lots of fluids.  Return to your normal diet.  Keep all your postoperative appointments as scheduled. SEEK IMMEDIATE MEDICAL CARE IF:  You develop a fever or temperature over 98.6 F (37 C).  You have a persistent headache.  You develop dizziness, fainting or lightheadedness.  You develop weakness, numbness or tingling in your arms or legs.  You have a skin rash.  You   have difficulty breathing  You have a stiff neck with or without stiff back.  You develop chest pain. Document Released: 02/15/2005 Document Revised: 05/10/2011 Document Reviewed: 03/25/2008 Exit[employer] Patient Information 2014 [employer].  

## 2012-09-15 NOTE — Progress Notes (Signed)
Very low. BP weight and urine results all reviewed and noted. Patient reports good fetal movement, denies any bleeding and no rupture of membranes symptoms or regular contractions. Patient is without complaints. All questions were answered.

## 2012-09-16 LAB — GC/CHLAMYDIA PROBE AMP: CT Probe RNA: NEGATIVE

## 2012-09-17 LAB — STREP B DNA PROBE: GBSP: NEGATIVE

## 2012-09-21 ENCOUNTER — Encounter: Payer: Self-pay | Admitting: Advanced Practice Midwife

## 2012-09-21 ENCOUNTER — Ambulatory Visit (INDEPENDENT_AMBULATORY_CARE_PROVIDER_SITE_OTHER): Payer: BC Managed Care – PPO | Admitting: Advanced Practice Midwife

## 2012-09-21 VITALS — BP 128/70 | Wt 178.0 lb

## 2012-09-21 DIAGNOSIS — Z3480 Encounter for supervision of other normal pregnancy, unspecified trimester: Secondary | ICD-10-CM

## 2012-09-21 DIAGNOSIS — Z3483 Encounter for supervision of other normal pregnancy, third trimester: Secondary | ICD-10-CM

## 2012-09-21 DIAGNOSIS — Z331 Pregnant state, incidental: Secondary | ICD-10-CM

## 2012-09-21 DIAGNOSIS — Z1389 Encounter for screening for other disorder: Secondary | ICD-10-CM

## 2012-09-21 LAB — POCT URINALYSIS DIPSTICK
Blood, UA: NEGATIVE
Protein, UA: NEGATIVE

## 2012-09-21 NOTE — Progress Notes (Signed)
Having "menstrual" cramps.  Cervix very posterior, vertex low. Routine questions about pregnancy answered.  F/U in 1 weeks for LROB.Marland Kitchen

## 2012-09-28 ENCOUNTER — Inpatient Hospital Stay (HOSPITAL_COMMUNITY)
Admission: AD | Admit: 2012-09-28 | Discharge: 2012-09-30 | DRG: 373 | Disposition: A | Discharge status: Home / Self Care | Payer: BC Managed Care – PPO | Source: Ambulatory Visit | Attending: Obstetrics & Gynecology | Admitting: Obstetrics & Gynecology

## 2012-09-28 ENCOUNTER — Encounter (HOSPITAL_COMMUNITY): Payer: Self-pay | Admitting: Anesthesiology

## 2012-09-28 ENCOUNTER — Inpatient Hospital Stay (HOSPITAL_COMMUNITY): Payer: BC Managed Care – PPO | Admitting: Anesthesiology

## 2012-09-28 ENCOUNTER — Encounter: Payer: BC Managed Care – PPO | Admitting: Women's Health

## 2012-09-28 ENCOUNTER — Encounter (HOSPITAL_COMMUNITY): Payer: Self-pay | Admitting: *Deleted

## 2012-09-28 DIAGNOSIS — Z3483 Encounter for supervision of other normal pregnancy, third trimester: Secondary | ICD-10-CM

## 2012-09-28 LAB — CBC
HCT: 35 % — ABNORMAL LOW (ref 36.0–46.0)
MCV: 84.3 fL (ref 78.0–100.0)
RBC: 4.15 MIL/uL (ref 3.87–5.11)
RDW: 13 % (ref 11.5–15.5)
WBC: 9.9 10*3/uL (ref 4.0–10.5)

## 2012-09-28 LAB — TYPE AND SCREEN

## 2012-09-28 LAB — ABO/RH: ABO/RH(D): A POS

## 2012-09-28 LAB — RPR: RPR Ser Ql: NONREACTIVE

## 2012-09-28 MED ORDER — OXYCODONE-ACETAMINOPHEN 5-325 MG PO TABS: 1.0000 | ORAL_TABLET | ORAL | Status: DC | PRN

## 2012-09-28 MED ORDER — DIPHENHYDRAMINE HCL 25 MG PO CAPS: 25.0000 mg | ORAL_CAPSULE | Freq: Four times a day (QID) | ORAL | Status: DC | PRN

## 2012-09-28 MED ORDER — LACTATED RINGERS IV SOLN: 500.0000 mL | Freq: Once | INTRAVENOUS | Status: DC

## 2012-09-28 MED ORDER — EPHEDRINE 5 MG/ML INJ
10.0000 mg | INTRAVENOUS | Status: DC | PRN
  Filled 2012-09-28: qty 4

## 2012-09-28 MED ORDER — NALBUPHINE SYRINGE 5 MG/0.5 ML: 10.0000 mg | INJECTION | INTRAMUSCULAR | Status: DC | PRN

## 2012-09-28 MED ORDER — LACTATED RINGERS IV SOLN: 500.0000 mL | INTRAVENOUS | Status: DC | PRN

## 2012-09-28 MED ORDER — ONDANSETRON HCL 4 MG/2ML IJ SOLN: 4.0000 mg | Freq: Four times a day (QID) | INTRAMUSCULAR | Status: DC | PRN

## 2012-09-28 MED ORDER — LIDOCAINE HCL (PF) 1 % IJ SOLN
INTRAMUSCULAR | Status: DC | PRN
  Administered 2012-09-28 (×2): 5 mL

## 2012-09-28 MED ORDER — PHENYLEPHRINE 40 MCG/ML (10ML) SYRINGE FOR IV PUSH (FOR BLOOD PRESSURE SUPPORT): 80.0000 ug | PREFILLED_SYRINGE | INTRAVENOUS | Status: DC | PRN

## 2012-09-28 MED ORDER — OXYTOCIN 40 UNITS IN LACTATED RINGERS INFUSION - SIMPLE MED
62.5000 mL/h | INTRAVENOUS | Status: DC
  Filled 2012-09-28: qty 1000

## 2012-09-28 MED ORDER — DIPHENHYDRAMINE HCL 50 MG/ML IJ SOLN
12.5000 mg | INTRAMUSCULAR | Status: DC | PRN
  Filled 2012-09-28: qty 1

## 2012-09-28 MED ORDER — SIMETHICONE 80 MG PO CHEW: 80.0000 mg | CHEWABLE_TABLET | ORAL | Status: DC | PRN

## 2012-09-28 MED ORDER — OXYTOCIN BOLUS FROM INFUSION: 500.0000 mL | INTRAVENOUS | Status: DC

## 2012-09-28 MED ORDER — CITRIC ACID-SODIUM CITRATE 334-500 MG/5ML PO SOLN: 30.0000 mL | ORAL | Status: DC | PRN

## 2012-09-28 MED ORDER — LACTATED RINGERS IV SOLN
INTRAVENOUS | Status: DC
  Administered 2012-09-28 (×2): via INTRAVENOUS

## 2012-09-28 MED ORDER — DIBUCAINE 1 % RE OINT
1.0000 "application " | TOPICAL_OINTMENT | RECTAL | Status: DC | PRN
  Filled 2012-09-28: qty 28

## 2012-09-28 MED ORDER — ONDANSETRON HCL 4 MG PO TABS: 4.0000 mg | ORAL_TABLET | ORAL | Status: DC | PRN

## 2012-09-28 MED ORDER — IBUPROFEN 600 MG PO TABS: 600.0000 mg | ORAL_TABLET | Freq: Four times a day (QID) | ORAL | Status: DC | PRN

## 2012-09-28 MED ORDER — BENZOCAINE-MENTHOL 20-0.5 % EX AERO
1.0000 "application " | INHALATION_SPRAY | CUTANEOUS | Status: DC | PRN
  Administered 2012-09-28: 1 via TOPICAL
  Filled 2012-09-28 (×2): qty 56

## 2012-09-28 MED ORDER — WITCH HAZEL-GLYCERIN EX PADS: 1.0000 "application " | MEDICATED_PAD | CUTANEOUS | Status: DC | PRN

## 2012-09-28 MED ORDER — PHENYLEPHRINE 40 MCG/ML (10ML) SYRINGE FOR IV PUSH (FOR BLOOD PRESSURE SUPPORT)
80.0000 ug | PREFILLED_SYRINGE | INTRAVENOUS | Status: DC | PRN
  Filled 2012-09-28: qty 5

## 2012-09-28 MED ORDER — OXYCODONE-ACETAMINOPHEN 5-325 MG PO TABS
1.0000 | ORAL_TABLET | ORAL | Status: DC | PRN
  Administered 2012-09-28 – 2012-09-30 (×6): 1 via ORAL
  Filled 2012-09-28 (×6): qty 1

## 2012-09-28 MED ORDER — ONDANSETRON HCL 4 MG/2ML IJ SOLN: 4.0000 mg | INTRAMUSCULAR | Status: DC | PRN

## 2012-09-28 MED ORDER — ZOLPIDEM TARTRATE 5 MG PO TABS: 5.0000 mg | ORAL_TABLET | Freq: Every evening | ORAL | Status: DC | PRN

## 2012-09-28 MED ORDER — EPHEDRINE 5 MG/ML INJ: 10.0000 mg | INTRAVENOUS | Status: DC | PRN

## 2012-09-28 MED ORDER — LANOLIN HYDROUS EX OINT: TOPICAL_OINTMENT | CUTANEOUS | Status: DC | PRN

## 2012-09-28 MED ORDER — SENNOSIDES-DOCUSATE SODIUM 8.6-50 MG PO TABS
2.0000 | ORAL_TABLET | Freq: Every day | ORAL | Status: DC
  Administered 2012-09-28 – 2012-09-29 (×2): 2 via ORAL

## 2012-09-28 MED ORDER — IBUPROFEN 600 MG PO TABS
600.0000 mg | ORAL_TABLET | Freq: Four times a day (QID) | ORAL | Status: DC
  Administered 2012-09-28 – 2012-09-30 (×8): 600 mg via ORAL
  Filled 2012-09-28 (×8): qty 1

## 2012-09-28 MED ORDER — LIDOCAINE HCL (PF) 1 % IJ SOLN
30.0000 mL | INTRAMUSCULAR | Status: DC | PRN
  Filled 2012-09-28: qty 30

## 2012-09-28 MED ORDER — ACETAMINOPHEN 325 MG PO TABS
650.0000 mg | ORAL_TABLET | ORAL | Status: DC | PRN
  Administered 2012-09-28: 650 mg via ORAL
  Filled 2012-09-28: qty 2

## 2012-09-28 MED ORDER — FENTANYL 2.5 MCG/ML BUPIVACAINE 1/10 % EPIDURAL INFUSION (WH - ANES)
14.0000 mL/h | INTRAMUSCULAR | Status: DC | PRN
  Administered 2012-09-28 (×2): 14 mL/h via EPIDURAL
  Filled 2012-09-28 (×2): qty 125

## 2012-09-28 MED ORDER — PRENATAL MULTIVITAMIN CH
1.0000 | ORAL_TABLET | Freq: Every day | ORAL | Status: DC
  Administered 2012-09-29 – 2012-09-30 (×2): 1 via ORAL
  Filled 2012-09-28: qty 1

## 2012-09-28 MED ORDER — TETANUS-DIPHTH-ACELL PERTUSSIS 5-2.5-18.5 LF-MCG/0.5 IM SUSP
0.5000 mL | Freq: Once | INTRAMUSCULAR | Status: AC
  Administered 2012-09-29: 0.5 mL via INTRAMUSCULAR
  Filled 2012-09-28 (×2): qty 0.5

## 2012-09-28 NOTE — MAU Note (Signed)
C/o strong ucs.

## 2012-09-28 NOTE — Progress Notes (Signed)
Patient ID: Alexandria Vincent, female   DOB: 06/12/1980, 32 y.o.   MRN: [id number]   S:  Pt comfortable with epidural  O:   Filed Vitals:   09/28/12 0432 09/28/12 0437 09/28/12 0459 09/28/12 0529  BP:   112/57 106/59  Pulse: 62 60 55 54  Temp:      TempSrc:      Resp:   18 18  Height:      Weight:      SpO2: 97% 100%      Dilation: 5.5 Effacement (%): 80 Cervical Position: Posterior Station: -2 Presentation: Vertex Exam by:: AL Rinehart RN  FHTs:  125, moderate variability, accels present, occasional decel (after shoulder) TOCO:  q 2-4 min  A/P 32 y.o. G2P1001 at [redacted]w[redacted]d with SOL - progressing without intervention - continue to manage expectantly - Anticipate SVD - FHTs reactive  Napoleon Form, MD

## 2012-09-28 NOTE — H&P (Signed)
Alexandria Vincent is a 32 y.o. female G2P1001 at [redacted]w[redacted]d presenting for contractions starting around 2 pm yesterday (7/30). They were irregular and mild at first becoming more intense later in the evening. No loss of fluid, vaginal bleeding. Baby moving normally.   Pt receives care at [facility]. No complications of this pregnancy or previous. Normal 2 hour GTT, declined genetic screening, normal ultrasounds. Normal first pregnancy and delivery.   Maternal Medical History:  Reason for admission: Contractions.   Contractions: Onset was 6-12 hours ago.    Fetal activity: Perceived fetal activity is normal.   Last perceived fetal movement was within the past hour.    Prenatal complications: no prenatal complications Prenatal Complications - Diabetes: none.    OB History   Grav Para Term Preterm Abortions TAB SAB Ect Mult Living   2 1 1       1      Past Medical History  Diagnosis Date  . Anxiety   . IBS (irritable bowel syndrome)   . Abnormal Pap smear 2011    asc hpv  . Sinus infection 05/18/2012   Past Surgical History  Procedure Laterality Date  . No past surgeries     Family History: family history includes Cancer in her maternal grandfather and Diabetes in her maternal grandfather. Social History:  reports that she has never smoked. She does not have any smokeless tobacco history on file. She reports that  drinks alcohol. She reports that she does not use illicit drugs.   Prenatal Transfer Tool  Maternal Diabetes: No Genetic Screening: Declined Maternal Ultrasounds/Referrals: Normal Fetal Ultrasounds or other Referrals:  None Maternal Substance Abuse:  No Significant Maternal Medications:  None Significant Maternal Lab Results:  Lab values include: Group B Strep negative Other Comments:  None  ROS  See HPI  Dilation: 4.5 Effacement (%): 80 Station: -1 Blood pressure 140/96, temperature 98.1 F (36.7 C), temperature source Oral, resp. rate 20, height 5\' 6"  (1.676 m),  weight 80.287 kg (177 lb), last menstrual period 12/21/2011, SpO2 100.00%. Maternal Exam:  Abdomen: Patient reports no abdominal tenderness. Introitus: Normal vulva. Normal vagina.  Pelvis: adequate for delivery.   Cervix: Cervix evaluated by digital exam.     Fetal Exam Fetal Monitor Review: Mode: ultrasound.   Baseline rate: 125.  Variability: moderate (6-25 bpm).   Pattern: accelerations present and no decelerations.    Fetal State Assessment: Category I - tracings are normal.     Physical Exam   GEN:  WNWD, no distress HEENT:  NCAT, EOMI, conjunctiva clear NECK:  Supple, non-tender, no thyromegaly, trachea midline CV: RRR, no murmur RESP:  CTAB ABD:  Soft, non-tender, no guarding or rebound, normal bowel sounds EXTREM:  Warm, well perfused, no edema or tenderness NEURO:  Alert, oriented, no focal deficits   Prenatal labs: ABO, Rh: A/Positive/-- (12/09 0000) Antibody: NEG (05/08 0921) Rubella: Immune (12/09 0000) RPR: NON REAC (05/08 0921)  HBsAg: Negative (12/09 0000)  HIV: NON REACTIVE (05/08 0921)  GBS: NEGATIVE (07/18 1141)   Assessment/Plan: 32 y.o. G2P1001 at [redacted]w[redacted]d with SOL - Admit to L&D - FHTs reactive - GBS negative - Epidural when desired - Anticipate SVD   Napoleon Form 09/28/2012, 2:54 AM

## 2012-09-28 NOTE — Anesthesia Procedure Notes (Signed)
Epidural Patient location during procedure: OB Start time: 09/28/2012 3:31 AM  Staffing Anesthesiologist: Angus Seller., Harrell Gave. Performed by: anesthesiologist   Preanesthetic Checklist Completed: patient identified, site marked, surgical consent, pre-op evaluation, timeout performed, IV checked, risks and benefits discussed and monitors and equipment checked  Epidural Patient position: sitting Prep: site prepped and draped and DuraPrep Patient monitoring: continuous pulse ox and blood pressure Approach: midline Injection technique: LOR air and LOR saline  Needle:  Needle type: Tuohy  Needle gauge: 17 G Needle length: 9 cm and 9 Needle insertion depth: 5 cm cm Catheter type: closed end flexible Catheter size: 19 Gauge Catheter at skin depth: 10 cm Test dose: negative  Assessment Events: blood not aspirated, injection not painful, no injection resistance, negative IV test and no paresthesia  Additional Notes Patient identified.  Risk benefits discussed including failed block, incomplete pain control, headache, nerve damage, paralysis, blood pressure changes, nausea, vomiting, reactions to medication both toxic or allergic, and postpartum back pain.  Patient expressed understanding and wished to proceed.  All questions were answered.  Sterile technique used throughout procedure and epidural site dressed with sterile barrier dressing. No paresthesia or other complications noted.The patient did not experience any signs of intravascular injection such as tinnitus or metallic taste in mouth nor signs of intrathecal spread such as rapid motor block. Please see nursing notes for vital signs.

## 2012-09-28 NOTE — Anesthesia Preprocedure Evaluation (Signed)
Anesthesia Evaluation  Patient identified by MRN, date of birth, ID band Patient awake    Reviewed: Allergy & Precautions, H&P , Patient's Chart, lab work & pertinent test results  Airway Mallampati: II TM Distance: >3 FB Neck ROM: full    Dental no notable dental hx.    Pulmonary neg pulmonary ROS,  breath sounds clear to auscultation  Pulmonary exam normal       Cardiovascular negative cardio ROS  Rhythm:regular Rate:Normal     Neuro/Psych PSYCHIATRIC DISORDERS negative neurological ROS  negative psych ROS   GI/Hepatic negative GI ROS, Neg liver ROS,   Endo/Other  negative endocrine ROS  Renal/GU negative Renal ROS     Musculoskeletal   Abdominal   Peds  Hematology negative hematology ROS (+)   Anesthesia Other Findings   Reproductive/Obstetrics (+) Pregnancy                           Anesthesia Physical Anesthesia Plan  ASA: II  Anesthesia Plan: Epidural   Post-op Pain Management:    Induction:   Airway Management Planned:   Additional Equipment:   Intra-op Plan:   Post-operative Plan:   Informed Consent: I have reviewed the patients History and Physical, chart, labs and discussed the procedure including the risks, benefits and alternatives for the proposed anesthesia with the patient or authorized representative who has indicated his/her understanding and acceptance.     Plan Discussed with:   Anesthesia Plan Comments:         Anesthesia Quick Evaluation

## 2012-09-28 NOTE — Progress Notes (Signed)
Alexandria Vincent is a 32 y.o. G2P1001 at [redacted]w[redacted]d admitted for onset of labor.  Subjective: Patient comfortable with epidural.   Objective: BP 124/70  Pulse 71  Temp(Src) 97.9 F (36.6 C) (Axillary)  Resp 18  Ht 5\' 10"  (1.778 m)  Wt 80.287 kg (177 lb)  BMI 25.4 kg/m2  SpO2 100%  LMP 12/21/2011      FHT:  FHR: 140 bpm, variability: moderate,  accelerations:  Present,  decelerations:  Absent UC:   regular, every 3 minutes SVE:   Dilation: 9 Effacement (%): 90 Station: -1 Exam by:: Dr. Ike Bene  Labs: Lab Results  Component Value Date   WBC 9.9 09/28/2012   HGB 11.5* 09/28/2012   HCT 35.0* 09/28/2012   MCV 84.3 09/28/2012   PLT 189 09/28/2012    Assessment / Plan: Spontaneous labor, progressing normally AROM at this time 11:40  Labor: Progressing normally Fetal Wellbeing:  Category I Pain Control:  Epidural I/D:  n/a Anticipated MOD:  NSVD  Tawni Carnes 09/28/2012, 11:48 AM

## 2012-09-28 NOTE — Progress Notes (Signed)
Ramiah Helfrich is a 32 y.o. G2P1001 at [redacted]w[redacted]d admitted for active labor  Subjective: Patient feeling the urge to push.  Objective: BP 116/61  Pulse 68  Temp(Src) 98.1 F (36.7 C) (Oral)  Resp 18  Ht 5\' 10"  (1.778 m)  Wt 80.287 kg (177 lb)  BMI 25.4 kg/m2  SpO2 100%  LMP 12/21/2011      FHT:  FHR: 110 bpm, variability: moderate,  accelerations:  Present,  decelerations:  Present deep variables, slow return UC:   regular, every 2 minutes SVE:   Dilation: 10 Effacement (%): 100 Station: +1;+2 Exam by:: Isac Sarna, RN  Labs: Lab Results  Component Value Date   WBC 9.9 09/28/2012   HGB 11.5* 09/28/2012   HCT 35.0* 09/28/2012   MCV 84.3 09/28/2012   PLT 189 09/28/2012    Assessment / Plan: Spontaneous labor, progressing normally Complete at 1341 Active second stage pushing Encourage patient pushing due to FHT Cat II  Labor: second stage Fetal Wellbeing:  Category II Pain Control:  Epidural I/D:  n/a Anticipated MOD:  NSVD  Tawni Carnes 09/28/2012, 2:03 PM

## 2012-09-29 NOTE — Lactation Note (Signed)
This note was copied from the chart of Alexandria Julianah Marciel. Lactation Consultation Note Baby now 20 hours old; asleep; family member holding baby, mom resting in bed. Mom states that baby has been sleepy since birth, that he had a meal in the middle of the night, and that she has attempted 2 times since then but baby has been sleeping.  Enc mom to continue frequent STS and cue based feeding; to continue trying to wake baby for feeding; discussed baby signs of stress and how long to attempt feeding before allowing baby to rest. Enc mom to call for assistance if she has any concerns.   Patient Name: Alexandria Vincent HQION'G Date: 09/29/2012 Reason for consult: Follow-up assessment   Maternal Data Has patient been taught Hand Expression?: Yes  Feeding Feeding Type: Breast Milk Length of feed: 0 min  LATCH Score/Interventions                      Lactation Tools Discussed/Used     Consult Status Consult Status: Follow-up Follow-up type: In-patient    Octavio Manns [facility] 09/29/2012, 11:24 AM

## 2012-09-29 NOTE — Progress Notes (Addendum)
Post Partum Day 1 Subjective: no complaints, up ad lib, voiding, tolerating PO and + flatus. Patient reports bleeding has not decreased. Decided to use Mirena for birth control at next personal provider appointment.  Is undecided whether she wants to go home this afternoon.  WIll let us know. Objective: Blood pressure 109/52, pulse 73, temperature 97.8 F (36.6 C), temperature source Oral, resp. rate 16, height 5\' 10"  (1.778 m), weight 80.287 kg (177 lb), last menstrual period 12/21/2011, SpO2 100.00%, unknown if currently breastfeeding.  Physical Exam:  General: alert, cooperative and no distress, pleasant Lochia:  Uterine Fundus: firm at umbilicus Incision: no incision DVT Evaluation: No evidence of DVT seen on physical exam. Negative Homan's sign. No cords or calf tenderness. No significant calf/ankle edema.   Recent Labs  09/28/12 0240  HGB 11.5*  HCT 35.0*    Assessment/Plan:  Possilbe Discharge home, Breastfeeding, Circumcision prior to discharge and Contraception Mirena to be inserted at next post-natal visit.   LOS: 1 day   Garnette Czech 09/29/2012, 7:57 AM   I have seen and examined this patient and agree the above assessment. CRESENZO-DISHMAN,Pasqual Farias 09/29/2012 8:06 AM

## 2012-09-29 NOTE — Anesthesia Postprocedure Evaluation (Signed)
Anesthesia Post Note  Patient: Alexandria Vincent  Procedure(s) Performed: * No procedures listed *  Anesthesia type: Epidural  Patient location: Mother/Baby  Post pain: Pain level controlled  Post assessment: Post-op Vital signs reviewed  Last Vitals:  Filed Vitals:   09/29/12 0630  BP: 109/52  Pulse: 73  Temp: 36.6 C  Resp: 16    Post vital signs: Reviewed  Level of consciousness: awake  Complications: No apparent anesthesia complications

## 2012-09-30 MED ORDER — IBUPROFEN 600 MG PO TABS: 600.0000 mg | ORAL_TABLET | Freq: Four times a day (QID) | ORAL | Status: DC

## 2012-09-30 NOTE — Discharge Summary (Signed)
Obstetric Discharge Summary Reason for Admission: onset of labor Prenatal Procedures: none Intrapartum Procedures: spontaneous vaginal delivery Postpartum Procedures: none Complications-Operative and Postpartum: none Hemoglobin  Date Value Range Status  09/28/2012 11.5* 12.0 - 15.0 g/dL Final     HCT  Date Value Range Status  09/28/2012 35.0* 36.0 - 46.0 % Final   Pt came in for onset of labor. Had an normal course and SVD of newborn female. Post partum care uncomplicated.  She is breast feeding and desires mirena for contraception  Physical Exam:  General: alert, cooperative and no distress Lochia: appropriate Uterine Fundus: firm Incision: na DVT Evaluation: No evidence of DVT seen on physical exam.  Discharge Diagnoses: Term Pregnancy-delivered  Discharge Information: Date: 09/30/2012 Activity: pelvic rest Diet: routine Medications: PNV, Ibuprofen and Colace Condition: stable Instructions: refer to practice specific booklet Discharge to: home Follow-up Information   Follow up with [facility] In 6 weeks.   Contact information:   [address] [place] [address] [phone]      Newborn Data: Live born female  Birth Weight: 7 lb 12.9 oz (3541 g) APGAR: 9, 9  Home with mother.  Alexandria Vincent L 09/30/2012, 10:06 AM

## 2012-09-30 NOTE — Lactation Note (Signed)
This note was copied from the chart of Alexandria Vincent. Lactation Consultation Note  Patient Name: Alexandria Vincent [id number] Date: 09/30/2012 Reason for consult: Follow-up assessment   Maternal Data    Feeding   LATCH Score/Interventions       Type of Nipple: Everted at rest and after stimulation  Comfort (Breast/Nipple): Filling, red/small blisters or bruises, mild/mod discomfort  Problem noted: Mild/Moderate discomfort Interventions (Mild/moderate discomfort): Comfort gels        Lactation Tools Discussed/Used     Consult Status Consult Status: Complete  Mom reports that baby cluster fed through the night and her nipples are sore. Left nipple slightly pink but intact- baby sleeping against right breast. Comfort gels given with instructions for use. Reviewed wide open mouth and keeping the baby close to the breast throughout the feeding. Suggested changing positions also. Asking about taking a break from nursing if the nipples get too sore. Has DEBP at home. Encouraged to be consistent with pumping to prevent engorgement and promote a good milk supply if she decides to do that. No further questions at present. To call prn  Pamelia Hoit 09/30/2012, 8:28 AM

## 2012-10-03 ENCOUNTER — Telehealth: Payer: Self-pay | Admitting: Adult Health

## 2012-10-03 NOTE — Telephone Encounter (Signed)
Spoke with pt. Delivered last Thursday. Having BMs daily, but its hard. + breastfeeding. Spoke with JAG. Advised Senokot. If this don't help, call us back. Pt voiced understanding. JSY

## 2012-10-09 ENCOUNTER — Telehealth: Payer: Self-pay | Admitting: Obstetrics & Gynecology

## 2012-10-09 ENCOUNTER — Ambulatory Visit (INDEPENDENT_AMBULATORY_CARE_PROVIDER_SITE_OTHER): Payer: BC Managed Care – PPO | Admitting: Women's Health

## 2012-10-09 ENCOUNTER — Encounter: Payer: Self-pay | Admitting: Women's Health

## 2012-10-09 VITALS — BP 110/70 | Ht 71.0 in | Wt 165.0 lb

## 2012-10-09 DIAGNOSIS — K5900 Constipation, unspecified: Secondary | ICD-10-CM

## 2012-10-09 NOTE — Patient Instructions (Signed)
Constipation  Drink plenty of fluid, preferably water, throughout the day  Eat foods high in fiber such as fruits, vegetables, and grains  Exercise, such as walking, is a good way to keep your bowels regular  Drink warm fluids, especially warm prune juice, or decaf coffee  Eat a 1/2 cup of real oatmeal (not instant), 1/2 cup applesauce, and 1/2-1 cup warm prune juice every day  If needed, you may take Colace (docusate sodium) stool softener once or twice a day to help keep the stool soft. If you are pregnant, wait until you are out of your first trimester (12-14 weeks of pregnancy)  If you still are having problems with constipation, you may take Miralax once daily as needed to help keep your bowels regular.  If you are pregnant, wait until you are out of your first trimester (12-14 weeks of pregnancy)   Constipation, Adult Constipation is when a person has fewer than 3 bowel movements a week; has difficulty having a bowel movement; or has stools that are dry, hard, or larger than normal. As people grow older, constipation is more common. If you try to fix constipation with medicines that make you have a bowel movement (laxatives), the problem may get worse. Long-term laxative use may cause the muscles of the colon to become weak. A low-fiber diet, not taking in enough fluids, and taking certain medicines may make constipation worse. CAUSES   Certain medicines, such as antidepressants, pain medicine, iron supplements, antacids, and water pills.   Certain diseases, such as diabetes, irritable bowel syndrome (IBS), thyroid disease, or depression.   Not drinking enough water.   Not eating enough fiber-rich foods.   Stress or travel.  Lack of physical activity or exercise.  Not going to the restroom when there is the urge to have a bowel movement.  Ignoring the urge to have a bowel movement.  Using laxatives too much. SYMPTOMS   Having fewer than 3 bowel movements a week.    Straining to have a bowel movement.   Having hard, dry, or larger than normal stools.   Feeling full or bloated.   Pain in the lower abdomen.  Not feeling relief after having a bowel movement. DIAGNOSIS  Your caregiver will take a medical history and perform a physical exam. Further testing may be done for severe constipation. Some tests may include:   A barium enema X-ray to examine your rectum, colon, and sometimes, your small intestine.  A sigmoidoscopy to examine your lower colon.  A colonoscopy to examine your entire colon. TREATMENT  Treatment will depend on the severity of your constipation and what is causing it. Some dietary treatments include drinking more fluids and eating more fiber-rich foods. Lifestyle treatments may include regular exercise. If these diet and lifestyle recommendations do not help, your caregiver may recommend taking over-the-counter laxative medicines to help you have bowel movements. Prescription medicines may be prescribed if over-the-counter medicines do not work.  HOME CARE INSTRUCTIONS   Increase dietary fiber in your diet, such as fruits, vegetables, whole grains, and beans. Limit high-fat and processed sugars in your diet, such as [place] fries, hamburgers, cookies, candies, and soda.   A fiber supplement may be added to your diet if you cannot get enough fiber from foods.   Drink enough fluids to keep your urine clear or pale yellow.   Exercise regularly or as directed by your caregiver.   Go to the restroom when you have the urge to go. Do not hold  it.  Only take medicines as directed by your caregiver. Do not take other medicines for constipation without talking to your caregiver first. SEEK IMMEDIATE MEDICAL CARE IF:   You have bright red blood in your stool.   Your constipation lasts for more than 4 days or gets worse.   You have abdominal or rectal pain.   You have thin, pencil-like stools.  You have unexplained  weight loss. MAKE SURE YOU:   Understand these instructions.  Will watch your condition.  Will get help right away if you are not doing well or get worse. Document Released: 11/14/2003 Document Revised: 05/10/2011 Document Reviewed: 01/19/2011 [facility] Patient Information 2014 [place], [place].

## 2012-10-09 NOTE — Progress Notes (Signed)
Patient ID: Alexandria Vincent, female   DOB: December 20, 1980, 32 y.o.   MRN: [id number] [name] is a 32 y.o. G64P2002 female app 1.5wks s/p SVD who presents w/ report of constipation since birth. Only hard balls of stool. Taking colace daily and an OTC stimulant w/o relief.   Recommended:   Drink plenty of fluid, preferably water, throughout the day  Eat foods high in fiber such as fruits, vegetables, and grains  Exercise, such as walking, is a good way to keep your bowels regular  Drink warm fluids, especially warm prune juice, or decaf coffee  Eat a 1/2 cup of real oatmeal (not instant), 1/2 cup applesauce, and 1/2-1 cup warm prune juice every day  Colace (docusate sodium) stool softener wice a day to help keep the stool soft.   Miralax once daily as needed   Fleets enema as needed  Keep pp visit as scheduled Call if above recommendations do not help  Cheral Marker, CNM, [facility] 10/09/2012 1:52 PM

## 2012-10-09 NOTE — Telephone Encounter (Signed)
Spoke with pt. Still having constipation issues. Tried Senokot and that caused stomach cramps. Tried Miralax this am, so far nothing. Appt scheduled for today. JSY

## 2012-10-09 NOTE — Progress Notes (Signed)
Patient ID: Alexandria Vincent, female   DOB: 1980-10-04, 32 y.o.   MRN: [id number] Pt here today for constipation. Nothing seems to help, only causes cramping.

## 2012-11-09 ENCOUNTER — Encounter: Payer: Self-pay | Admitting: Adult Health

## 2012-11-09 ENCOUNTER — Ambulatory Visit (INDEPENDENT_AMBULATORY_CARE_PROVIDER_SITE_OTHER): Payer: BC Managed Care – PPO | Admitting: Adult Health

## 2012-11-09 DIAGNOSIS — Z3200 Encounter for pregnancy test, result unknown: Secondary | ICD-10-CM

## 2012-11-09 DIAGNOSIS — F53 Postpartum depression: Secondary | ICD-10-CM | POA: Insufficient documentation

## 2012-11-09 DIAGNOSIS — Z3202 Encounter for pregnancy test, result negative: Secondary | ICD-10-CM

## 2012-11-09 LAB — POCT URINE PREGNANCY: Preg Test, Ur: NEGATIVE

## 2012-11-09 MED ORDER — ESCITALOPRAM OXALATE 10 MG PO TABS: 10.0000 mg | ORAL_TABLET | Freq: Every day | ORAL | Status: DC

## 2012-11-09 NOTE — Progress Notes (Signed)
Patient ID: Alexandria Vincent, female   DOB: 10-03-1980, 32 y.o.   MRN: [id number] Alexandria Vincent is a 32 year old white female married in for her postpartum visit.  Delivery Date:09/28/12  Method of Delivery: Vaginal delivery of baby boy,Aubrey, 7\' 12" . Had 2nd degree tear with repair  Sexual Activity since delivery: No  Method of Feeding: breast feeding x 3 weeks now bottle  Number of weeks bleeding post delivery: 4-5  Review of Systems: Patient denies any headaches, blurred vision, shortness of breath, chest pain, abdominal pain, problems with bowel movements, urination, or intercourse.No joint pains, has moody moments and feels overwhelmed at times.Can be teary too.Not as bad as with first child, but her 37 year old is strong willed.will Rx lexapro, she does not feel counseling is needed but will go if changes.Wants IUD for 5 years.She is a Runner, 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  Reviewed past medical,surgical, social and family history. Reviewed medications and allergies.    Depression Score: 11  BP 110/66  Ht 5\' 11"  (1.803 m)  Wt 160 lb (72.576 kg)  BMI 22.33 kg/m2  Breastfeeding? No Pelvic Exam:   External genitalia is normal in appearance.  The vagina is normal in appearance. The cervix is bulbous.  Uterus is felt to be normal size, shape, and contour, well involuted.  No adnexal masses or tenderness noted.   Impression:  Status post delivery, post partum check, depression screening, contraceptive management. Postpartum depression  Plan:   Take time for self,let some things go, Rx lexapro 10 mg #30 1 daily with 6 refills Call with menses for IUD insertion, no sex til then Follow up in 6 weeks, can call anytime if needs to talk Review handout on postpartum depression

## 2012-11-09 NOTE — Patient Instructions (Addendum)

## 2012-12-04 ENCOUNTER — Ambulatory Visit (INDEPENDENT_AMBULATORY_CARE_PROVIDER_SITE_OTHER): Payer: BC Managed Care – PPO | Admitting: Adult Health

## 2012-12-04 VITALS — BP 120/72 | Ht 70.0 in | Wt 160.0 lb

## 2012-12-04 DIAGNOSIS — Z3200 Encounter for pregnancy test, result unknown: Secondary | ICD-10-CM

## 2012-12-04 DIAGNOSIS — Z3202 Encounter for pregnancy test, result negative: Secondary | ICD-10-CM

## 2012-12-04 LAB — POCT URINE PREGNANCY: Preg Test, Ur: NEGATIVE

## 2012-12-08 ENCOUNTER — Ambulatory Visit: Payer: BC Managed Care – PPO | Admitting: Adult Health

## 2012-12-13 ENCOUNTER — Encounter: Payer: Self-pay | Admitting: Women's Health

## 2012-12-13 ENCOUNTER — Ambulatory Visit (INDEPENDENT_AMBULATORY_CARE_PROVIDER_SITE_OTHER): Payer: BC Managed Care – PPO | Admitting: Women's Health

## 2012-12-13 VITALS — BP 110/68 | Ht 71.0 in | Wt 162.5 lb

## 2012-12-13 DIAGNOSIS — Z30430 Encounter for insertion of intrauterine contraceptive device: Secondary | ICD-10-CM

## 2012-12-13 DIAGNOSIS — Z3202 Encounter for pregnancy test, result negative: Secondary | ICD-10-CM

## 2012-12-13 LAB — POCT URINE PREGNANCY: Preg Test, Ur: NEGATIVE

## 2012-12-13 NOTE — Progress Notes (Signed)
Patient ID: Alexandria Vincent, female   DOB: 1980-08-12, 32 y.o.   MRN: [id number] [name] is a 32 y.o. year old G63P2002 Caucasian female ~8wks s/p SVD, who presents for placement of a Mirena IUD. Bottlefeeding. Was placed on lexapro 10mg  daily by JAG at pp visit, pt states she took 1 or 2, but can't remember to take them and feels like she's doing fine w/o them.  No SI/HI/II.  Has gained 2lbs and is upset about that, requests ideas to help lose weight. Recommended exercise and calorie control.  She thinks her last pap was 54yrs ago and was normal.   Patient's last menstrual period was 12/06/2012. Last sexual intercourse was 4wks ago, and pregnancy test today was neg  The risks and benefits of the method and placement have been thouroughly reviewed with the patient and all questions were answered.  Specifically the patient is aware of failure rate of 03/998, expulsion of the IUD and of possible perforation.  The patient is aware of irregular bleeding due to the method and understands the incidence of irregular bleeding diminishes with time.  Signed copy of informed consent in chart.   Time out was performed.  A Pederson speculum was placed in the vagina.  The cervix was visualized, prepped using Betadine, and grasped with a single tooth tenaculum. The uterus was found to be neutral and it sounded to 8 cm.  Mirena IUD placed per manufacturer's recommendations.   The strings were trimmed to 3 cm.  Sonogram was performed and the proper placement of the IUD was verified via transvaginal u/s.   The patient was given post procedure instructions, including signs and symptoms of infection and to check for the strings after each menses or each month, and refraining from intercourse or anything in the vagina for 3 days.  She was given a Mirena care card with date Mirena placed, and date Mirena to be removed.  She is scheduled for a return appointment in for pap/physical.    Marge Duncans 12/13/2012 10:04 AM

## 2012-12-13 NOTE — Patient Instructions (Signed)
Intrauterine Device Insertion Care After Refer to this sheet in the next few weeks. These instructions provide you with information on caring for yourself after your procedure. Your caregiver may also give you more specific instructions. Your treatment has been planned according to current medical practices, but problems sometimes occur. Call your caregiver if you have any problems or questions after your procedure. HOME CARE INSTRUCTIONS   Only take over-the-counter or prescription medicines for pain, discomfort, or fever as directed by your caregiver. Do not use aspirin. This may increase bleeding.  Check your IUD to make sure it is in place before you resume sexual activity. You should be able to feel the strings. If you cannot feel the strings, something may be wrong. The IUD may have fallen out of the uterus, or the uterus may have been punctured (perforated) during placement. Also, if the strings are getting longer, it may mean that the IUD is being forced out of the uterus. You no longer have full protection from pregnancy if any of these problems occur.  You may resume sexual intercourse if you are not having problems with the IUD. The IUD is considered immediately effective.  You may resume normal activities.  Keep all follow-up appointments to be sure your IUD has remained in place. After the first exam, yearly exams are advised, unless you cannot feel the strings of your IUD.  Continue to check that the IUD is still in place by feeling for the strings after every menstrual period. SEEK MEDICAL CARE IF:   You have bleeding that is heavier or lasts longer than a normal menstrual cycle.  You have a fever.  You have increasing cramps or abdominal pain not relieved with medicine.  You have abdominal pain that does not seem to be related to the same area of earlier cramping and pain.  You are lightheaded, unusually weak, or faint.  You have abnormal vaginal discharge or  smells.  You have pain during sexual intercourse.  You cannot feel the IUD strings, or the IUD string has gotten longer.  You feel the IUD at the opening of the cervix in the vagina.  You think you are pregnant, or you miss your menstrual period.  The IUD string is hurting your sex partner. Document Released: 10/14/2010 Document Revised: 05/10/2011 Document Reviewed: 10/14/2010 [employer] Patient Information 2014 [employer].  

## 2013-01-08 ENCOUNTER — Telehealth: Payer: Self-pay | Admitting: Obstetrics and Gynecology

## 2013-01-08 NOTE — Telephone Encounter (Signed)
IUD inserted by 1 month ago, started period by 2 weeks. Is this normal. Pt informed can have some irregular bleeding with new contraceptive to continue to monitor if bleeding continues pt to call office back. Pt states can still feel IUD string and u/s done for placement at time of IUD insertion.

## 2013-01-16 ENCOUNTER — Other Ambulatory Visit (HOSPITAL_COMMUNITY)
Admission: RE | Admit: 2013-01-16 | Discharge: 2013-01-16 | Disposition: A | Discharge status: Home / Self Care | Payer: BC Managed Care – PPO | Source: Ambulatory Visit | Attending: Obstetrics and Gynecology | Admitting: Obstetrics and Gynecology

## 2013-01-16 ENCOUNTER — Ambulatory Visit (INDEPENDENT_AMBULATORY_CARE_PROVIDER_SITE_OTHER): Payer: BC Managed Care – PPO | Admitting: Adult Health

## 2013-01-16 ENCOUNTER — Encounter (INDEPENDENT_AMBULATORY_CARE_PROVIDER_SITE_OTHER): Payer: Self-pay

## 2013-01-16 ENCOUNTER — Encounter: Payer: Self-pay | Admitting: Adult Health

## 2013-01-16 VITALS — BP 110/68 | HR 72 | Ht 71.0 in | Wt 164.0 lb

## 2013-01-16 DIAGNOSIS — Z975 Presence of (intrauterine) contraceptive device: Secondary | ICD-10-CM

## 2013-01-16 DIAGNOSIS — Z01419 Encounter for gynecological examination (general) (routine) without abnormal findings: Secondary | ICD-10-CM | POA: Insufficient documentation

## 2013-01-16 HISTORY — DX: Presence of (intrauterine) contraceptive device: Z97.5

## 2013-01-16 NOTE — Progress Notes (Signed)
Patient ID: Alexandria Vincent, female   DOB: 01/21/1981, 32 y.o.   MRN: [id number] History of Present Illness: Alexandria Vincent is a 32 year old white female, married in for a pap and physical.Got IUD in October and has had bleeding since,no pain.   Current Medications, Allergies, Past Medical History, Past Surgical History, Family History and Social History were reviewed in [employer] record.     Review of Systems: Patient denies any headaches, blurred vision, shortness of breath, chest pain, abdominal pain, problems with bowel movements, urination, or intercourse. 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    Physical Exam:BP 110/68  Pulse 72  Ht 5\' 11"  (1.803 m)  Wt 164 lb (74.39 kg)  BMI 22.88 kg/m2  LMP 12/19/2012  Breastfeeding? No General:  Well developed, well nourished, no acute distress Skin:  Warm and dry Neck:  Midline trachea, normal thyroid Lungs; Clear to auscultation bilaterally Breast:  No dominant palpable mass, retraction, or nipple discharge Cardiovascular: Regular rate and rhythm Abdomen:  Soft, non tender, no hepatosplenomegaly Pelvic:  External genitalia is normal in appearance.  The vagina is normal in appearance, has period like blood. The cervix is bulbous, everted at os + IUD strings, pap performed.  Uterus is felt to be normal size, shape, and contour.  No  adnexal masses or tenderness noted. Extremities:  No swelling or varicosities noted Psych:  No mood changes, alert and cooperative, seems happy   Impression: Yearly gyn exam IUD in place    Plan: Physical in 1 year If bleeding persists, call Do core exercises

## 2013-01-16 NOTE — Assessment & Plan Note (Signed)
2014-October inserted

## 2013-01-16 NOTE — Patient Instructions (Signed)
Physical in 1 year Call if bleeding persists

## 2013-02-20 ENCOUNTER — Telehealth: Payer: Self-pay | Admitting: Adult Health

## 2013-02-20 MED ORDER — LORAZEPAM 0.5 MG PO TABS: ORAL_TABLET | ORAL | Status: DC

## 2013-02-20 NOTE — Telephone Encounter (Signed)
Not taking lexapro but will, but wants something that helps quick will rx ativan

## 2013-02-20 NOTE — Telephone Encounter (Signed)
Spoke with pt. Was put on gen Lexapro. She can't remember to take the pill. It was probably 2 weeks ago when she last took the pill. Pt states she needs something "more for the moment". Pt gets overwhelmed, crying frequently. She don't feel depressed and not suicidal. Pt states she will be glad to make an appt but was wondering what you recommended since Christmas was coming up. Please advise. Thanks!!!

## 2013-02-20 NOTE — Telephone Encounter (Signed)
Left message x 1. JSY 

## 2013-04-25 ENCOUNTER — Ambulatory Visit: Payer: BC Managed Care – PPO | Admitting: Women's Health

## 2013-08-01 ENCOUNTER — Encounter: Payer: Self-pay | Admitting: Advanced Practice Midwife

## 2013-08-01 ENCOUNTER — Ambulatory Visit (INDEPENDENT_AMBULATORY_CARE_PROVIDER_SITE_OTHER): Payer: BC Managed Care – PPO | Admitting: Advanced Practice Midwife

## 2013-08-01 VITALS — BP 100/60 | Ht 71.0 in | Wt 153.0 lb

## 2013-08-01 DIAGNOSIS — G43109 Migraine with aura, not intractable, without status migrainosus: Secondary | ICD-10-CM

## 2013-08-01 MED ORDER — SUMATRIPTAN SUCCINATE 100 MG PO TABS: 100.0000 mg | ORAL_TABLET | Freq: Once | ORAL | Status: DC | PRN

## 2013-08-01 NOTE — Patient Instructions (Signed)
[facility] 

## 2013-08-01 NOTE — Progress Notes (Signed)
   [facility] Visit  Patient name: Alexandria Vincent MRN [id number]  Date of birth: 06/07/1980  CC & HPI:  Alexandria Vincent is a 33 y.o. Caucasian female presenting today for migraines.  She has a hx of migraine with aura (vision changes) about once a month.  She got a Mirena IUD 11/14, and has noticed she is now having migraines as often as once a week, several times lasting 2-3 days.   Pertinent History Reviewed:  Medical & Surgical Hx:   Past Medical History  Diagnosis Date  . Anxiety   . IBS (irritable bowel syndrome)   . Abnormal Pap smear 2011    asc hpv  . Sinus infection 05/18/2012  . 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 IUD (intrauterine device) in place 01/16/2013   Past Surgical History  Procedure Laterality Date  . No past surgeries     Medications: Reviewed & Updated - Current outpatient prescriptions:levonorgestrel (MIRENA) 20 MCG/24HR IUD, 1 each by Intrauterine route once., Disp: , Rfl: ;  flintstones complete (FLINTSTONES) 60 MG chewable tablet, Chew 2 tablets by mouth daily. , Disp: , Rfl: ;  SUMAtriptan (IMITREX) 100 MG tablet, Take 1 tablet (100 mg total) by mouth once as needed for migraine or headache. May repeat in 2 hours if headache persists or recurs., Disp: 9 tablet, Rfl: 11  Social History: Reviewed -  reports that she has never smoked. She has never used smokeless tobacco.  Objective Findings:  Vitals: BP 100/60  Ht 5\' 11"  (1.803 m)  Wt 153 lb (69.4 kg)  BMI 21.35 kg/m2  Physical Examination: General appearance - alert, well appearing, and in no distress and oriented to person, place, and time Eyes - denies vision changes Neurological - Denies HA now  No results found for this or any previous visit (from the past 24 hour(s)).   Assessment & Plan:  A:   Migraines with aura, more frequent since Mirena insertion P:  It is quite possible that the IUD is responsible for the increase in frequency of HA.  Because she really wants to try to keep  the IUD, recommend seeing [facility] to see if they can help her w/o removing IUD.  If necessary, will remove  Alexandria Vincent CNM6/04/2013 4:55 PM

## 2013-08-24 ENCOUNTER — Observation Stay (HOSPITAL_COMMUNITY)
Admission: EM | Admit: 2013-08-24 | Discharge: 2013-08-26 | Disposition: A | Discharge status: Home / Self Care | Payer: BC Managed Care – PPO | Attending: General Surgery | Admitting: General Surgery

## 2013-08-24 ENCOUNTER — Encounter (HOSPITAL_COMMUNITY): Payer: Self-pay | Admitting: Emergency Medicine

## 2013-08-24 DIAGNOSIS — Z975 Presence of (intrauterine) contraceptive device: Secondary | ICD-10-CM | POA: Insufficient documentation

## 2013-08-24 DIAGNOSIS — K3580 Unspecified acute appendicitis: Principal | ICD-10-CM | POA: Diagnosis present

## 2013-08-24 DIAGNOSIS — F411 Generalized anxiety disorder: Secondary | ICD-10-CM | POA: Insufficient documentation

## 2013-08-24 DIAGNOSIS — K589 Irritable bowel syndrome without diarrhea: Secondary | ICD-10-CM | POA: Insufficient documentation

## 2013-08-24 DIAGNOSIS — N83209 Unspecified ovarian cyst, unspecified side: Secondary | ICD-10-CM | POA: Insufficient documentation

## 2013-08-24 LAB — COMPREHENSIVE METABOLIC PANEL
ALT: 14 U/L (ref 0–35)
AST: 24 U/L (ref 0–37)
Albumin: 4.3 g/dL (ref 3.5–5.2)
Alkaline Phosphatase: 57 U/L (ref 39–117)
BILIRUBIN TOTAL: 0.5 mg/dL (ref 0.3–1.2)
BUN: 21 mg/dL (ref 6–23)
CHLORIDE: 102 meq/L (ref 96–112)
CO2: 21 meq/L (ref 19–32)
CREATININE: 0.81 mg/dL (ref 0.50–1.10)
Calcium: 9.8 mg/dL (ref 8.4–10.5)
GFR calc Af Amer: >90 mL/min (ref >90)
GLUCOSE: 99 mg/dL (ref 70–99)
Potassium: 4.5 mEq/L (ref 3.7–5.3)
Sodium: 142 mEq/L (ref 137–147)
Total Protein: 7.7 g/dL (ref 6.0–8.3)

## 2013-08-24 LAB — CBC WITH DIFFERENTIAL/PLATELET
BASOS ABS: 0 10*3/uL (ref 0.0–0.1)
Basophils Relative: 0 % (ref 0–1)
Eosinophils Absolute: 0.1 10*3/uL (ref 0.0–0.7)
Eosinophils Relative: 1 % (ref 0–5)
HEMATOCRIT: 37.1 % (ref 36.0–46.0)
HEMOGLOBIN: 12.2 g/dL (ref 12.0–15.0)
LYMPHS ABS: 2.2 10*3/uL (ref 0.7–4.0)
LYMPHS PCT: 24 % (ref 12–46)
MCH: 28.6 pg (ref 26.0–34.0)
MCHC: 32.9 g/dL (ref 30.0–36.0)
MCV: 86.9 fL (ref 78.0–100.0)
MONO ABS: 0.6 10*3/uL (ref 0.1–1.0)
Monocytes Relative: 7 % (ref 3–12)
NEUTROS ABS: 6.1 10*3/uL (ref 1.7–7.7)
Neutrophils Relative %: 68 % (ref 43–77)
Platelets: 266 10*3/uL (ref 150–400)
RBC: 4.27 MIL/uL (ref 3.87–5.11)
RDW: 12 % (ref 11.5–15.5)
WBC: 8.9 10*3/uL (ref 4.0–10.5)

## 2013-08-24 LAB — LIPASE, BLOOD: Lipase: 26 U/L (ref 11–59)

## 2013-08-24 MED ORDER — FENTANYL CITRATE 0.05 MG/ML IJ SOLN
50.0000 ug | Freq: Once | INTRAMUSCULAR | Status: AC
  Administered 2013-08-24: 50 ug via INTRAVENOUS
  Filled 2013-08-24: qty 2

## 2013-08-24 MED ORDER — FENTANYL CITRATE 0.05 MG/ML IJ SOLN
50.0000 ug | Freq: Once | INTRAMUSCULAR | Status: AC
  Administered 2013-08-24: 50 ug via INTRAVENOUS

## 2013-08-24 MED ORDER — ONDANSETRON HCL 4 MG/2ML IJ SOLN
4.0000 mg | Freq: Once | INTRAMUSCULAR | Status: AC
  Administered 2013-08-24: 4 mg via INTRAVENOUS
  Filled 2013-08-24: qty 2

## 2013-08-24 NOTE — ED Notes (Signed)
Dr Pickering at bedside 

## 2013-08-24 NOTE — ED Provider Notes (Signed)
CSN: [id number]     Arrival date & time 08/24/13  2235 History   First MD Initiated Contact with Patient 08/24/13 2327     Chief Complaint  Patient presents with  . Abdominal Pain     (Consider location/radiation/quality/duration/timing/severity/associated sxs/prior Treatment) Patient is a 33 y.o. female presenting with abdominal pain. The history is provided by the patient.  Abdominal Pain Associated symptoms: nausea and vomiting   Associated symptoms: no chest pain, no constipation, no diarrhea and no shortness of breath    patient began with upper abdominal pain around 4 hours ago. Acute onset. She's had nausea with vomiting. She states she felt as if she ate it would get better. No fevers. No diarrhea. No constipation. She has a decreased appetite. She has not had pains like this before. It started after abdomen was down. She denies dysuria. she's had no previous surgery on her abdomen.   Past Medical History  Diagnosis Date  . Anxiety   . IBS (irritable bowel syndrome)   . Abnormal Pap smear 2011    asc hpv  . Sinus infection 05/18/2012  . 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 IUD (intrauterine device) in place 01/16/2013   Past Surgical History  Procedure Laterality Date  . No past surgeries     Family History  Problem Relation Age of Onset  . Cancer Maternal Grandfather     lung, bone  . Diabetes Maternal Grandfather    History  Substance Use Topics  . Smoking status: Never Smoker   . Smokeless tobacco: Never Used  . Alcohol Use: Yes     Comment: 1-2 drinks a week; wine once a week   OB History   Grav Para Term Preterm Abortions TAB SAB Ect Mult Living   2 2 2       2      Review of Systems  Constitutional: Negative for activity change and appetite change.  Eyes: Negative for pain.  Respiratory: Negative for chest tightness and shortness of breath.   Cardiovascular: Negative for chest pain and leg swelling.  Gastrointestinal: Positive for nausea, vomiting and  abdominal pain. Negative for diarrhea and constipation.  Genitourinary: Negative for flank pain.  Musculoskeletal: Negative for back pain and neck stiffness.  Skin: Negative for rash.  Neurological: Negative for weakness, numbness and headaches.  Psychiatric/Behavioral: Negative for behavioral problems.      Allergies  Other  Home Medications   Prior to Admission medications   Medication Sig Start Date End Date Taking? Authorizing Provider  flintstones complete (FLINTSTONES) 60 MG chewable tablet Chew 2 tablets by mouth daily.    Yes Historical Provider, MD  levonorgestrel (MIRENA) 20 MCG/24HR IUD 1 each by Intrauterine route once.   Yes Historical Provider, MD  SUMAtriptan (IMITREX) 100 MG tablet Take 1 tablet (100 mg total) by mouth once as needed for migraine or headache. May repeat in 2 hours if headache persists or recurs. 08/01/13  Yes Jacklyn ShellFrances Cresenzo-Dishmon, CNM   BP 127/71  Pulse 47  Temp(Src) 97.5 F (36.4 C) (Oral)  Resp 16  SpO2 97%  LMP 08/14/2013 Physical Exam  Nursing note and vitals reviewed. Constitutional: She is oriented to person, place, and time. She appears well-developed and well-nourished.  HENT:  Head: Normocephalic and atraumatic.  Eyes: EOM are normal. Pupils are equal, round, and reactive to light.  Neck: Normal range of motion. Neck supple.  Cardiovascular: Normal rate, regular rhythm and normal heart sounds.   No murmur heard. Pulmonary/Chest: Effort normal and breath  sounds normal. No respiratory distress. She has no wheezes. She has no rales.  Abdominal: Soft. Bowel sounds are normal. She exhibits no distension. There is tenderness. There is no rebound and no guarding.  Mild right lower quadrant tenderness without rebound or guarding.  Musculoskeletal: Normal range of motion.  Neurological: She is alert and oriented to person, place, and time. No cranial nerve deficit.  Skin: Skin is warm and dry.  Psychiatric: She has a normal mood and  affect. Her speech is normal.    ED Course  Procedures (including critical care time) Labs Review Labs Reviewed  CBC WITH DIFFERENTIAL  COMPREHENSIVE METABOLIC PANEL  LIPASE, BLOOD  POC URINE PREG, ED    Imaging Review Ct Abdomen Pelvis W Contrast  08/25/2013   CLINICAL DATA:  Upper and lower abdominal pain with tenderness. Nausea and vomiting.  EXAM: CT ABDOMEN AND PELVIS WITH CONTRAST  TECHNIQUE: Multidetector CT imaging of the abdomen and pelvis was performed using the standard protocol following bolus administration of intravenous contrast.  CONTRAST:  100mL OMNIPAQUE IOHEXOL 300 MG/ML  SOLN  COMPARISON:  None.  FINDINGS: BODY WALL: Unremarkable.  LOWER CHEST: Unremarkable.  ABDOMEN/PELVIS:  Liver: There is volume loss and mild hypo enhancement involving the lower, anterior right liver, consistent with scarring. There is associated concavity of the right anterior chest wall which could be developmental or posttraumatic. There is a tiny hypervascular focus in the upper midline liver, subcapsular, which is incidental based on age.  Biliary: Distended gallbladder without evidence of wall thickening or calcified stone.  Pancreas: Unremarkable.  Spleen: Unremarkable.  Adrenals: Unremarkable.  Kidneys and ureters: 3 mm nonobstructive stone in the lower pole right kidney  Bladder: Unremarkable.  Reproductive: 3.3 cm cyst in the left adnexa, likely intra-ovarian and follicular. There is an IUD which is well positioned.  Bowel: The appendix is dilated and thick walled. Outer wall diameter measures 14 mm. There is internal debris and appendicoliths. There is mild periappendiceal stranding. Negative for perforation. No bowel obstruction.  Retroperitoneum: No mass or adenopathy.  Peritoneum: Trace pelvic free fluid  Vascular: No acute abnormality.  OSSEOUS: No acute abnormalities.  Critical Value/emergent results were called by telephone at the time of interpretation on 08/25/2013 at 2:38 AM to Dr. Benjiman CoreNATHAN  PICKERING , who verbally acknowledged these results.  IMPRESSION: 1. Acute, non-perforated appendicitis. 2. Nonobstructive right nephrolithiasis. 3. Additional incidental findings are noted above.   Electronically Signed   By: Tiburcio PeaJonathan  Watts M.D.   On: 08/25/2013 02:41     EKG Interpretation None      MDM   Final diagnoses:  Acute appendicitis, unspecified acute appendicitis type    Patient with acute abdominal pain. CT scan shows appendicitis. Lab work normal. General surgery will see patient and likely admitted for or    Juliet Rudeathan R. Rubin PayorPickering, MD 08/25/13 [id number]

## 2013-08-24 NOTE — ED Notes (Addendum)
Presents with upper quadrant abdominal radiates into mid and lower abdomen and back associated with nausea and vomiting. Pain is worse with standing ans straightening body,  crouched in ball makes pain better. Pain began at 6 pm. Pt is tearful. Abdomen is tender.  Denies diarrhea. Unable to hold food or water down. Pain rated 10/10 and is intermittent

## 2013-08-25 ENCOUNTER — Observation Stay (HOSPITAL_COMMUNITY): Payer: BC Managed Care – PPO | Admitting: Certified Registered Nurse Anesthetist

## 2013-08-25 ENCOUNTER — Emergency Department (HOSPITAL_COMMUNITY): Payer: BC Managed Care – PPO

## 2013-08-25 ENCOUNTER — Encounter (HOSPITAL_COMMUNITY): Payer: BC Managed Care – PPO | Admitting: Certified Registered Nurse Anesthetist

## 2013-08-25 ENCOUNTER — Encounter (HOSPITAL_COMMUNITY)
Admission: EM | Disposition: A | Discharge status: Home / Self Care | Payer: Self-pay | Source: Home / Self Care | Attending: Emergency Medicine

## 2013-08-25 ENCOUNTER — Encounter (HOSPITAL_COMMUNITY): Payer: Self-pay

## 2013-08-25 DIAGNOSIS — R1031 Right lower quadrant pain: Secondary | ICD-10-CM

## 2013-08-25 DIAGNOSIS — K3580 Unspecified acute appendicitis: Secondary | ICD-10-CM

## 2013-08-25 DIAGNOSIS — R112 Nausea with vomiting, unspecified: Secondary | ICD-10-CM

## 2013-08-25 HISTORY — PX: APPENDECTOMY: SHX54

## 2013-08-25 HISTORY — PX: LAPAROSCOPIC APPENDECTOMY: SHX408

## 2013-08-25 LAB — SURGICAL PCR SCREEN
MRSA, PCR: NEGATIVE
Staphylococcus aureus: NEGATIVE

## 2013-08-25 LAB — POC URINE PREG, ED: PREG TEST UR: NEGATIVE

## 2013-08-25 SURGERY — APPENDECTOMY, LAPAROSCOPIC
Anesthesia: General | Site: Abdomen

## 2013-08-25 MED ORDER — LACTATED RINGERS IV SOLN
INTRAVENOUS | Status: DC | PRN
  Administered 2013-08-25 (×2): via INTRAVENOUS

## 2013-08-25 MED ORDER — MEPERIDINE HCL 25 MG/ML IJ SOLN: 6.2500 mg | INTRAMUSCULAR | Status: DC | PRN

## 2013-08-25 MED ORDER — HYDROMORPHONE HCL PF 1 MG/ML IJ SOLN
1.0000 mg | Freq: Once | INTRAMUSCULAR | Status: AC
  Administered 2013-08-25: 1 mg via INTRAVENOUS
  Filled 2013-08-25: qty 1

## 2013-08-25 MED ORDER — SODIUM CHLORIDE 0.9 % IV SOLN
3.0000 g | Freq: Once | INTRAVENOUS | Status: AC
  Administered 2013-08-25: 3 g via INTRAVENOUS
  Filled 2013-08-25: qty 3

## 2013-08-25 MED ORDER — BUPIVACAINE-EPINEPHRINE (PF) 0.25% -1:200000 IJ SOLN
INTRAMUSCULAR | Status: AC
  Filled 2013-08-25: qty 30

## 2013-08-25 MED ORDER — SODIUM CHLORIDE 0.9 % IV SOLN
3.0000 g | Freq: Four times a day (QID) | INTRAVENOUS | Status: DC
  Filled 2013-08-25 (×3): qty 3

## 2013-08-25 MED ORDER — GLYCOPYRROLATE 0.2 MG/ML IJ SOLN
INTRAMUSCULAR | Status: DC | PRN
  Administered 2013-08-25: 0.6 mg via INTRAVENOUS
  Administered 2013-08-25: 0.2 mg via INTRAVENOUS

## 2013-08-25 MED ORDER — PROMETHAZINE HCL 25 MG/ML IJ SOLN
INTRAMUSCULAR | Status: AC
  Administered 2013-08-25: 6.25 mg via INTRAVENOUS
  Filled 2013-08-25: qty 1

## 2013-08-25 MED ORDER — 0.9 % SODIUM CHLORIDE (POUR BTL) OPTIME
TOPICAL | Status: DC | PRN
  Administered 2013-08-25: 1000 mL

## 2013-08-25 MED ORDER — ONDANSETRON HCL 4 MG PO TABS: 4.0000 mg | ORAL_TABLET | Freq: Four times a day (QID) | ORAL | Status: DC | PRN

## 2013-08-25 MED ORDER — SODIUM CHLORIDE 0.9 % IR SOLN
Status: DC | PRN
  Administered 2013-08-25: 1000 mL

## 2013-08-25 MED ORDER — ONDANSETRON HCL 4 MG/2ML IJ SOLN
4.0000 mg | Freq: Once | INTRAMUSCULAR | Status: AC
  Administered 2013-08-25: 4 mg via INTRAVENOUS

## 2013-08-25 MED ORDER — TAMSULOSIN HCL 0.4 MG PO CAPS
0.4000 mg | ORAL_CAPSULE | Freq: Every day | ORAL | Status: DC
  Administered 2013-08-25 – 2013-08-26 (×2): 0.4 mg via ORAL
  Filled 2013-08-25 (×2): qty 1

## 2013-08-25 MED ORDER — SUCCINYLCHOLINE CHLORIDE 20 MG/ML IJ SOLN
INTRAMUSCULAR | Status: AC
  Filled 2013-08-25: qty 1

## 2013-08-25 MED ORDER — HYDROMORPHONE HCL PF 1 MG/ML IJ SOLN
1.0000 mg | Freq: Once | INTRAMUSCULAR | Status: AC
  Administered 2013-08-25: 1 mg via INTRAVENOUS
  Filled 2013-08-25 (×2): qty 1

## 2013-08-25 MED ORDER — IOHEXOL 300 MG/ML  SOLN
100.0000 mL | Freq: Once | INTRAMUSCULAR | Status: AC | PRN
  Administered 2013-08-25: 100 mL via INTRAVENOUS

## 2013-08-25 MED ORDER — DEXAMETHASONE SODIUM PHOSPHATE 4 MG/ML IJ SOLN
INTRAMUSCULAR | Status: DC | PRN
  Administered 2013-08-25: 4 mg via INTRAVENOUS

## 2013-08-25 MED ORDER — OXYCODONE HCL 5 MG PO TABS: 5.0000 mg | ORAL_TABLET | Freq: Once | ORAL | Status: DC | PRN

## 2013-08-25 MED ORDER — LIDOCAINE HCL (CARDIAC) 20 MG/ML IV SOLN
INTRAVENOUS | Status: AC
  Filled 2013-08-25: qty 5

## 2013-08-25 MED ORDER — SUCCINYLCHOLINE CHLORIDE 20 MG/ML IJ SOLN
INTRAMUSCULAR | Status: DC | PRN
  Administered 2013-08-25: 100 mg via INTRAVENOUS

## 2013-08-25 MED ORDER — ROCURONIUM BROMIDE 100 MG/10ML IV SOLN
INTRAVENOUS | Status: DC | PRN
  Administered 2013-08-25 (×3): 5 mg via INTRAVENOUS
  Administered 2013-08-25: 25 mg via INTRAVENOUS

## 2013-08-25 MED ORDER — PROPOFOL 10 MG/ML IV BOLUS
INTRAVENOUS | Status: AC
  Filled 2013-08-25: qty 20

## 2013-08-25 MED ORDER — BUPIVACAINE HCL (PF) 0.25 % IJ SOLN
INTRAMUSCULAR | Status: AC
  Filled 2013-08-25: qty 30

## 2013-08-25 MED ORDER — SODIUM CHLORIDE 0.9 % IV SOLN
3.0000 g | Freq: Four times a day (QID) | INTRAVENOUS | Status: AC
  Administered 2013-08-25: 3 g via INTRAVENOUS
  Filled 2013-08-25: qty 3

## 2013-08-25 MED ORDER — MORPHINE SULFATE 2 MG/ML IJ SOLN: 1.0000 mg | INTRAMUSCULAR | Status: DC | PRN

## 2013-08-25 MED ORDER — ROCURONIUM BROMIDE 100 MG/10ML IV SOLN: INTRAVENOUS | Status: DC | PRN

## 2013-08-25 MED ORDER — ONDANSETRON HCL 4 MG/2ML IJ SOLN: 4.0000 mg | Freq: Four times a day (QID) | INTRAMUSCULAR | Status: DC | PRN

## 2013-08-25 MED ORDER — KCL IN DEXTROSE-NACL 20-5-0.45 MEQ/L-%-% IV SOLN
INTRAVENOUS | Status: DC
  Administered 2013-08-25 – 2013-08-26 (×3): via INTRAVENOUS
  Filled 2013-08-25 (×5): qty 1000

## 2013-08-25 MED ORDER — ENOXAPARIN SODIUM 40 MG/0.4ML ~~LOC~~ SOLN: 40.0000 mg | Freq: Every day | SUBCUTANEOUS | Status: DC

## 2013-08-25 MED ORDER — SUMATRIPTAN SUCCINATE 100 MG PO TABS
100.0000 mg | ORAL_TABLET | Freq: Once | ORAL | Status: AC | PRN
  Filled 2013-08-25: qty 1

## 2013-08-25 MED ORDER — PROPOFOL 10 MG/ML IV BOLUS
INTRAVENOUS | Status: DC | PRN
  Administered 2013-08-25: 150 mg via INTRAVENOUS

## 2013-08-25 MED ORDER — NEOSTIGMINE METHYLSULFATE 10 MG/10ML IV SOLN
INTRAVENOUS | Status: DC | PRN
  Administered 2013-08-25: 4 mg via INTRAVENOUS

## 2013-08-25 MED ORDER — ACETAMINOPHEN 325 MG PO TABS: 650.0000 mg | ORAL_TABLET | ORAL | Status: DC | PRN

## 2013-08-25 MED ORDER — ONDANSETRON HCL 4 MG/2ML IJ SOLN
INTRAMUSCULAR | Status: AC
  Filled 2013-08-25: qty 2

## 2013-08-25 MED ORDER — OXYCODONE-ACETAMINOPHEN 5-325 MG PO TABS
1.0000 | ORAL_TABLET | ORAL | Status: DC | PRN
  Administered 2013-08-25 – 2013-08-26 (×5): 2 via ORAL
  Filled 2013-08-25 (×4): qty 2

## 2013-08-25 MED ORDER — OXYCODONE-ACETAMINOPHEN 5-325 MG PO TABS: 1.0000 | ORAL_TABLET | ORAL | Status: DC | PRN

## 2013-08-25 MED ORDER — HYDROMORPHONE HCL PF 1 MG/ML IJ SOLN: 1.0000 mg | INTRAMUSCULAR | Status: DC | PRN

## 2013-08-25 MED ORDER — ROCURONIUM BROMIDE 50 MG/5ML IV SOLN
INTRAVENOUS | Status: AC
  Filled 2013-08-25: qty 1

## 2013-08-25 MED ORDER — PROMETHAZINE HCL 25 MG/ML IJ SOLN
6.2500 mg | INTRAMUSCULAR | Status: DC | PRN
  Administered 2013-08-25: 6.25 mg via INTRAVENOUS

## 2013-08-25 MED ORDER — HYDROMORPHONE HCL PF 1 MG/ML IJ SOLN
0.2500 mg | INTRAMUSCULAR | Status: DC | PRN
  Administered 2013-08-25 (×2): 0.5 mg via INTRAVENOUS

## 2013-08-25 MED ORDER — SODIUM CHLORIDE 0.9 % IV SOLN
INTRAVENOUS | Status: DC
  Administered 2013-08-25: 05:00:00 via INTRAVENOUS

## 2013-08-25 MED ORDER — LIDOCAINE HCL (CARDIAC) 20 MG/ML IV SOLN
INTRAVENOUS | Status: DC | PRN
  Administered 2013-08-25: 40 mg via INTRAVENOUS

## 2013-08-25 MED ORDER — OXYCODONE HCL 5 MG/5ML PO SOLN: 5.0000 mg | Freq: Once | ORAL | Status: DC | PRN

## 2013-08-25 MED ORDER — HYDROMORPHONE HCL PF 1 MG/ML IJ SOLN
INTRAMUSCULAR | Status: AC
  Administered 2013-08-25: 0.5 mg via INTRAVENOUS
  Filled 2013-08-25: qty 1

## 2013-08-25 MED ORDER — OXYCODONE-ACETAMINOPHEN 5-325 MG PO TABS
ORAL_TABLET | ORAL | Status: AC
  Administered 2013-08-25: 2 via ORAL
  Filled 2013-08-25: qty 2

## 2013-08-25 MED ORDER — MIDAZOLAM HCL 5 MG/5ML IJ SOLN
INTRAMUSCULAR | Status: DC | PRN
  Administered 2013-08-25: 2 mg via INTRAVENOUS

## 2013-08-25 MED ORDER — ONDANSETRON HCL 4 MG/2ML IJ SOLN
4.0000 mg | Freq: Once | INTRAMUSCULAR | Status: AC
  Administered 2013-08-25: 4 mg via INTRAVENOUS
  Filled 2013-08-25: qty 2

## 2013-08-25 MED ORDER — FENTANYL CITRATE 0.05 MG/ML IJ SOLN
INTRAMUSCULAR | Status: DC | PRN
  Administered 2013-08-25 (×2): 50 ug via INTRAVENOUS
  Administered 2013-08-25 (×2): 25 ug via INTRAVENOUS
  Administered 2013-08-25: 100 ug via INTRAVENOUS

## 2013-08-25 MED ORDER — ONDANSETRON HCL 4 MG/2ML IJ SOLN
INTRAMUSCULAR | Status: DC | PRN
  Administered 2013-08-25: 4 mg via INTRAVENOUS

## 2013-08-25 MED ORDER — FENTANYL CITRATE 0.05 MG/ML IJ SOLN
INTRAMUSCULAR | Status: AC
  Filled 2013-08-25: qty 5

## 2013-08-25 MED ORDER — MIDAZOLAM HCL 2 MG/2ML IJ SOLN: 0.5000 mg | Freq: Once | INTRAMUSCULAR | Status: DC | PRN

## 2013-08-25 MED ORDER — MIDAZOLAM HCL 2 MG/2ML IJ SOLN
INTRAMUSCULAR | Status: AC
  Filled 2013-08-25: qty 2

## 2013-08-25 MED ORDER — ENOXAPARIN SODIUM 40 MG/0.4ML ~~LOC~~ SOLN
40.0000 mg | SUBCUTANEOUS | Status: DC
  Administered 2013-08-26: 40 mg via SUBCUTANEOUS
  Filled 2013-08-25 (×2): qty 0.4

## 2013-08-25 MED ORDER — BUPIVACAINE-EPINEPHRINE 0.25% -1:200000 IJ SOLN
INTRAMUSCULAR | Status: DC | PRN
  Administered 2013-08-25: 30 mL

## 2013-08-25 SURGICAL SUPPLY — 46 items
APPLIER CLIP ROT 10 11.4 M/L (STAPLE)
BENZOIN TINCTURE PRP APPL 2/3 (GAUZE/BANDAGES/DRESSINGS) IMPLANT
BLADE SURG ROTATE 9660 (MISCELLANEOUS) IMPLANT
CANISTER SUCTION 2500CC (MISCELLANEOUS) ×3 IMPLANT
CHLORAPREP W/TINT 26ML (MISCELLANEOUS) ×3 IMPLANT
CLIP APPLIE ROT 10 11.4 M/L (STAPLE) IMPLANT
COVER SURGICAL LIGHT HANDLE (MISCELLANEOUS) ×3 IMPLANT
CUTTER FLEX LINEAR 45M (STAPLE) ×6 IMPLANT
DECANTER SPIKE VIAL GLASS SM (MISCELLANEOUS) IMPLANT
DERMABOND ADVANCED (GAUZE/BANDAGES/DRESSINGS) ×2
DERMABOND ADVANCED .7 DNX12 (GAUZE/BANDAGES/DRESSINGS) ×1 IMPLANT
DRAPE UTILITY 15X26 W/TAPE STR (DRAPE) ×6 IMPLANT
ELECT REM PT RETURN 9FT ADLT (ELECTROSURGICAL) ×3
ELECTRODE REM PT RTRN 9FT ADLT (ELECTROSURGICAL) ×1 IMPLANT
ENDOLOOP SUT PDS II  0 18 (SUTURE)
ENDOLOOP SUT PDS II 0 18 (SUTURE) IMPLANT
GAUZE SPONGE 2X2 8PLY STRL LF (GAUZE/BANDAGES/DRESSINGS) IMPLANT
GLOVE BIOGEL M STRL SZ7.5 (GLOVE) ×3 IMPLANT
GLOVE BIOGEL PI IND STRL 8 (GLOVE) ×1 IMPLANT
GLOVE BIOGEL PI INDICATOR 8 (GLOVE) ×2
GOWN STRL REUS W/ TWL LRG LVL3 (GOWN DISPOSABLE) ×2 IMPLANT
GOWN STRL REUS W/ TWL XL LVL3 (GOWN DISPOSABLE) ×1 IMPLANT
GOWN STRL REUS W/TWL LRG LVL3 (GOWN DISPOSABLE) ×4
GOWN STRL REUS W/TWL XL LVL3 (GOWN DISPOSABLE) ×2
KIT BASIN OR (CUSTOM PROCEDURE TRAY) ×3 IMPLANT
KIT ROOM TURNOVER OR (KITS) ×3 IMPLANT
NS IRRIG 1000ML POUR BTL (IV SOLUTION) ×3 IMPLANT
PAD ARMBOARD 7.5X6 YLW CONV (MISCELLANEOUS) ×6 IMPLANT
POUCH SPECIMEN RETRIEVAL 10MM (ENDOMECHANICALS) ×3 IMPLANT
RELOAD /EVU35 (ENDOMECHANICALS) IMPLANT
RELOAD 45 VASCULAR/THIN (ENDOMECHANICALS) ×6 IMPLANT
RELOAD STAPLE TA45 3.5 REG BLU (ENDOMECHANICALS) IMPLANT
SCALPEL HARMONIC ACE (MISCELLANEOUS) ×3 IMPLANT
SCISSORS LAP 5X35 DISP (ENDOMECHANICALS) IMPLANT
SET IRRIG TUBING LAPAROSCOPIC (IRRIGATION / IRRIGATOR) ×3 IMPLANT
SPECIMEN JAR SMALL (MISCELLANEOUS) IMPLANT
SPONGE GAUZE 2X2 STER 10/PKG (GAUZE/BANDAGES/DRESSINGS)
SUT MNCRL AB 4-0 PS2 18 (SUTURE) ×3 IMPLANT
SUT VICRYL 0 UR6 27IN ABS (SUTURE) IMPLANT
TOWEL OR 17X24 6PK STRL BLUE (TOWEL DISPOSABLE) ×3 IMPLANT
TOWEL OR 17X26 10 PK STRL BLUE (TOWEL DISPOSABLE) ×3 IMPLANT
TRAY FOLEY CATH 14FR (SET/KITS/TRAYS/PACK) ×3 IMPLANT
TRAY FOLEY CATH 16FR SILVER (SET/KITS/TRAYS/PACK) IMPLANT
TRAY LAPAROSCOPIC (CUSTOM PROCEDURE TRAY) ×3 IMPLANT
TROCAR XCEL BLADELESS 5X75MML (TROCAR) ×6 IMPLANT
TROCAR XCEL BLUNT TIP 100MML (ENDOMECHANICALS) ×3 IMPLANT

## 2013-08-25 NOTE — ED Notes (Signed)
Dr. Tsui at bedside.  

## 2013-08-25 NOTE — ED Notes (Signed)
Pt. Told pharmacy tech that she purchased the vicodin but not the macrobid yesterday.  She state to me that she could not afford the meds.

## 2013-08-25 NOTE — Progress Notes (Addendum)
Patient has been unable to void since surgery today. Bladder scan done and revealed 539cc. MD notified. Order received.

## 2013-08-25 NOTE — Discharge Instructions (Signed)
[facility] ______[facility] °LAPAROSCOPIC SURGERY: POST OP INSTRUCTIONS °Always review your discharge instruction sheet given to you by the facility where your surgery was performed. °IF YOU HAVE DISABILITY OR FAMILY LEAVE FORMS, YOU MUST BRING THEM TO THE OFFICE FOR PROCESSING.   °DO NOT GIVE THEM TO YOUR DOCTOR. ° °1. A prescription for pain medication may be given to you upon discharge.  Take your pain medication as prescribed, if needed.  If narcotic pain medicine is not needed, then you may take acetaminophen (Tylenol) or ibuprofen (Advil) as needed. °2. Take your usually prescribed medications unless otherwise directed. °3. If you need a refill on your pain medication, please contact your pharmacy.  They will contact our office to request authorization. Prescriptions will not be filled after 5pm or on week-ends. °4. You should follow a light diet the first few days after arrival home, such as soup and crackers, etc.  Be sure to include lots of fluids daily. °5. Most patients will experience some swelling and bruising in the area of the incisions.  Ice packs will help.  Swelling and bruising can take several days to resolve.  °6. It is common to experience some constipation if taking pain medication after surgery.  Increasing fluid intake and taking a stool softener (such as Colace) will usually help or prevent this problem from occurring.  A mild laxative (Milk of Magnesia or Miralax) should be taken according to package instructions if there are no bowel movements after 48 hours. °7. Unless discharge instructions indicate otherwise, you may remove your bandages 24-48 hours after surgery, and you may shower at that time.  You may have steri-strips (small skin tapes) in place directly over the incision.  These strips should be left on the skin for 7-10 days.  If your surgeon used skin glue on the incision, you may shower in 24 hours.  The glue will flake off over the next 2-3 weeks.  Any sutures or  staples will be removed at the office during your follow-up visit. °8. ACTIVITIES:  You may resume regular (light) daily activities beginning the next day--such as daily self-care, walking, climbing stairs--gradually increasing activities as tolerated.  You may have sexual intercourse when it is comfortable.  Refrain from any heavy lifting or straining until approved by your doctor. °a. You may drive when you are no longer taking prescription pain medication, you can comfortably wear a seatbelt, and you can safely maneuver your car and apply brakes. °b. RETURN TO WORK:  __________________________________________________________ °9. You should see your doctor in the office for a follow-up appointment approximately 2-3 weeks after your surgery.  Make sure that you call for this appointment within a day or two after you arrive home to insure a convenient appointment time. °10. OTHER INSTRUCTIONS: __________________________________________________________________________________________________________________________ __________________________________________________________________________________________________________________________ °WHEN TO CALL YOUR DOCTOR: °1. Fever over 101.0 °2. Inability to urinate °3. Continued bleeding from incision. °4. Increased pain, redness, or drainage from the incision. °5. Increasing abdominal pain ° °The clinic staff is available to answer your questions during regular business hours.  Please don’t hesitate to call and ask to speak to one of the nurses for clinical concerns.  If you have a medical emergency, go to the nearest emergency room or call 911.  A surgeon from [facility] is always on call at the hospital. °[address], [address], [place], [place]  [address] ? [address] [address], [place], [place]   [address] °[phone] ? [phone] ? FAX [phone] °Web site:   [email or web address] °

## 2013-08-25 NOTE — ED Notes (Signed)
Pt returned from CT and ambulated to restroom unassisted without difficulty

## 2013-08-25 NOTE — Op Note (Signed)
Alexandria ClickCarolyn Percifield [id number] 11/29/1980 08/25/2013  Appendectomy, Lap, Procedure Note  Indications: The patient presented with a history of right-sided abdominal pain. A CT revealed findings consistent with acute appendicitis. Please see h&p for additional details  Pre-operative Diagnosis: Acute appendicitis without mention of peritonitis  Post-operative Diagnosis: Same  Surgeon: Atilano InaWILSON,ERIC M   Assistants: none  Anesthesia: General endotracheal anesthesia  ASA Class: 1  Procedure Details  The patient was seen again in the Holding Room. The risks, benefits, complications, treatment options, and expected outcomes were discussed with the patient and/or family. The possibilities of perforation of viscus, bleeding, recurrent infection, the need for additional procedures, failure to diagnose a condition, and creating a complication requiring transfusion or operation were discussed. There was concurrence with the proposed plan and informed consent was obtained. The site of surgery was properly noted. The patient was taken to Operating Room, identified as Alexandria Vincent and the procedure verified as Appendectomy. A Time Out was held and the above information confirmed. She was on therapeutic antibiotic  The patient was placed in the supine position and general anesthesia was induced, along with placement of orogastric tube, SCDs, and a Foley catheter. The abdomen was prepped and draped in a sterile fashion. A 1.5 centimeter supraumbilical incision was made thru an old belly button ring scar. The umbilical stalk was elevated, and the midline fascia was incised with a #11 blade.  A Kelly clamp was used to confirm entrance into the peritoneal cavity.  A pursestring suture was passed around the incision with a 0 Vicryl.  A 12mm Hasson was introduced into the abdomen and the tails of the suture were used to hold the Hasson in place.   The pneumoperitoneum was then established to steady pressure of 15  mmHg.  Additional 5 mm cannulas then placed in the left lower quadrant of the abdomen and the suprapubic region under direct visualization. A careful evaluation of the entire abdomen was carried out. The patient was placed in Trendelenburg and left lateral decubitus position. The small intestines were retracted in the cephalad and left lateral direction away from the pelvis and right lower quadrant. The patient was found to have an inflammed appendix that was extending into the pelvis. There was no evidence of perforation. She had a right ovarian cyst  The appendix was carefully dissected. The appendix was was skeletonized with the harmonic scalpel.   The appendix was divided at its base using an endo-GIA stapler with a white load. No appendiceal stump was left in place. The appendix was removed from the abdomen with an Endocatch bag through the umbilical port.  There was no evidence of bleeding, leakage, or complication after division of the appendix. Irrigation was also performed and irrigate suctioned from the abdomen as well.  The umbilical port site was closed with the purse string suture. The closure was viewed laparoscopically. There was no residual palpable fascial defect.  The trocar site skin wounds were closed with 4-0 Monocryl. Dermabond was applied to the skin incisions.  Instrument, sponge, and needle counts were correct at the conclusion of the case.   Findings: The appendix was found to be inflamed. There were not signs of necrosis.  There was not perforation. There was not abscess formation.  Estimated Blood Loss:  Minimal         Drains: none         Specimens: appendix         Complications:  None; patient tolerated the procedure well.  Disposition: PACU - hemodynamically stable.         Condition: stable  Mary SellaEric M. Andrey CampanileWilson, MD, FACS General, Bariatric, & Minimally Invasive Surgery [facility]  \

## 2013-08-25 NOTE — ED Notes (Signed)
MD at bedside. 

## 2013-08-25 NOTE — Anesthesia Preprocedure Evaluation (Signed)
Anesthesia Evaluation  Patient identified by MRN, date of birth, ID band Patient awake    Reviewed: Allergy & Precautions, H&P , NPO status , Patient's Chart, lab work & pertinent test results  History of Anesthesia Complications Negative for: history of anesthetic complications  Airway Mallampati: I TM Distance: >3 FB Neck ROM: Full    Dental  (+) Teeth Intact, Dental Advisory Given   Pulmonary neg pulmonary ROS,  breath sounds clear to auscultation  Pulmonary exam normal       Cardiovascular negative cardio ROS  Rhythm:Regular Rate:Normal     Neuro/Psych negative neurological ROS     GI/Hepatic Neg liver ROS, N/v with appy   Endo/Other  negative endocrine ROS  Renal/GU negative Renal ROS     Musculoskeletal   Abdominal   Peds  Hematology negative hematology ROS (+)   Anesthesia Other Findings   Reproductive/Obstetrics 08/25/13 preg test: NEG                           Anesthesia Physical Anesthesia Plan  ASA: I  Anesthesia Plan: General   Post-op Pain Management:    Induction: Intravenous and Rapid sequence  Airway Management Planned: Oral ETT  Additional Equipment:   Intra-op Plan:   Post-operative Plan: Extubation in OR  Informed Consent: I have reviewed the patients History and Physical, chart, labs and discussed the procedure including the risks, benefits and alternatives for the proposed anesthesia with the patient or authorized representative who has indicated his/her understanding and acceptance.   Dental advisory given  Plan Discussed with: Surgeon and CRNA  Anesthesia Plan Comments: (Plan routine monitors, GETA)        Anesthesia Quick Evaluation

## 2013-08-25 NOTE — H&P (Signed)
Alexandria Vincent is an 33 y.o. female.   Chief Complaint: Abdominal pain, nausea, vomiting HPI: This is a 33 yo female in good health who presents with several hours of acute generalized abdominal pain, nausea, vomiting, now localized pain in the RLQ.  She reported subjective fever.  She came to the ED for evaluation and was found to have appendicitis with no sign of perforation.  Past Medical History  Diagnosis Date  . Anxiety   . IBS (irritable bowel syndrome)   . Abnormal Pap smear 2011    asc hpv  . Sinus infection 05/18/2012  . 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 IUD (intrauterine device) in place 01/16/2013    Past Surgical History  Procedure Laterality Date  . No past surgeries      Family History  Problem Relation Age of Onset  . Cancer Maternal Grandfather     lung, bone  . Diabetes Maternal Grandfather    Social History:  reports that she has never smoked. She has never used smokeless tobacco. She reports that she drinks alcohol. She reports that she does not use illicit drugs.  Allergies:  Allergies  Allergen Reactions  . Other Swelling    Walnuts cause angio edema   Prior to Admission medications   Medication Sig Start Date End Date Taking? Authorizing Provider  flintstones complete (FLINTSTONES) 60 MG chewable tablet Chew 2 tablets by mouth daily.    Yes Historical Provider, MD  levonorgestrel (MIRENA) 20 MCG/24HR IUD 1 each by Intrauterine route once.   Yes Historical Provider, MD  SUMAtriptan (IMITREX) 100 MG tablet Take 1 tablet (100 mg total) by mouth once as needed for migraine or headache. May repeat in 2 hours if headache persists or recurs. 08/01/13  Yes Christin Fudge, CNM     Results for orders placed during the hospital encounter of 08/24/13 (from the past 48 hour(s))  CBC WITH DIFFERENTIAL     Status: None   Collection Time    08/24/13 10:56 PM      Result Value Ref Range   WBC 8.9  4.0 - 10.5 K/uL   RBC 4.27  3.87 - 5.11 MIL/uL    Hemoglobin 12.2  12.0 - 15.0 g/dL   HCT 37.1  36.0 - 46.0 %   MCV 86.9  78.0 - 100.0 fL   MCH 28.6  26.0 - 34.0 pg   MCHC 32.9  30.0 - 36.0 g/dL   RDW 12.0  11.5 - 15.5 %   Platelets 266  150 - 400 K/uL   Neutrophils Relative % 68  43 - 77 %   Neutro Abs 6.1  1.7 - 7.7 K/uL   Lymphocytes Relative 24  12 - 46 %   Lymphs Abs 2.2  0.7 - 4.0 K/uL   Monocytes Relative 7  3 - 12 %   Monocytes Absolute 0.6  0.1 - 1.0 K/uL   Eosinophils Relative 1  0 - 5 %   Eosinophils Absolute 0.1  0.0 - 0.7 K/uL   Basophils Relative 0  0 - 1 %   Basophils Absolute 0.0  0.0 - 0.1 K/uL  COMPREHENSIVE METABOLIC PANEL     Status: None   Collection Time    08/24/13 10:56 PM      Result Value Ref Range   Sodium 142  137 - 147 mEq/L   Potassium 4.5  3.7 - 5.3 mEq/L   Chloride 102  96 - 112 mEq/L   CO2 21  19 - 32 mEq/L  Glucose, Bld 99  70 - 99 mg/dL   BUN 21  6 - 23 mg/dL   Creatinine, Ser 0.81  0.50 - 1.10 mg/dL   Calcium 9.8  8.4 - 10.5 mg/dL   Total Protein 7.7  6.0 - 8.3 g/dL   Albumin 4.3  3.5 - 5.2 g/dL   AST 24  0 - 37 U/L   ALT 14  0 - 35 U/L   Alkaline Phosphatase 57  39 - 117 U/L   Total Bilirubin 0.5  0.3 - 1.2 mg/dL   GFR calc non Af Amer >90  >90 mL/min   GFR calc Af Amer >90  >90 mL/min   Comment: (NOTE)     The eGFR has been calculated using the CKD EPI equation.     This calculation has not been validated in all clinical situations.     eGFR's persistently <90 mL/min signify possible Chronic Kidney     Disease.  LIPASE, BLOOD     Status: None   Collection Time    08/24/13 10:56 PM      Result Value Ref Range   Lipase 26  11 - 59 U/L  POC URINE PREG, ED     Status: None   Collection Time    08/25/13  2:44 AM      Result Value Ref Range   Preg Test, Ur NEGATIVE  NEGATIVE   Comment:            THE SENSITIVITY OF THIS     METHODOLOGY IS >24 mIU/mL   Ct Abdomen Pelvis W Contrast  08/25/2013   CLINICAL DATA:  Upper and lower abdominal pain with tenderness. Nausea and vomiting.   EXAM: CT ABDOMEN AND PELVIS WITH CONTRAST  TECHNIQUE: Multidetector CT imaging of the abdomen and pelvis was performed using the standard protocol following bolus administration of intravenous contrast.  CONTRAST:  133m OMNIPAQUE IOHEXOL 300 MG/ML  SOLN  COMPARISON:  None.  FINDINGS: BODY WALL: Unremarkable.  LOWER CHEST: Unremarkable.  ABDOMEN/PELVIS:  Liver: There is volume loss and mild hypo enhancement involving the lower, anterior right liver, consistent with scarring. There is associated concavity of the right anterior chest wall which could be developmental or posttraumatic. There is a tiny hypervascular focus in the upper midline liver, subcapsular, which is incidental based on age.  Biliary: Distended gallbladder without evidence of wall thickening or calcified stone.  Pancreas: Unremarkable.  Spleen: Unremarkable.  Adrenals: Unremarkable.  Kidneys and ureters: 3 mm nonobstructive stone in the lower pole right kidney  Bladder: Unremarkable.  Reproductive: 3.3 cm cyst in the left adnexa, likely intra-ovarian and follicular. There is an IUD which is well positioned.  Bowel: The appendix is dilated and thick walled. Outer wall diameter measures 14 mm. There is internal debris and appendicoliths. There is mild periappendiceal stranding. Negative for perforation. No bowel obstruction.  Retroperitoneum: No mass or adenopathy.  Peritoneum: Trace pelvic free fluid  Vascular: No acute abnormality.  OSSEOUS: No acute abnormalities.  Critical Value/emergent results were called by telephone at the time of interpretation on 08/25/2013 at 2:38 AM to Dr. NDavonna Belling, who verbally acknowledged these results.  IMPRESSION: 1. Acute, non-perforated appendicitis. 2. Nonobstructive right nephrolithiasis. 3. Additional incidental findings are noted above.   Electronically Signed   By: JJorje GuildM.D.   On: 08/25/2013 02:41    Review of Systems  Constitutional: Positive for fever. Negative for weight loss.  HENT:  Negative for ear discharge, ear pain, hearing loss and tinnitus.  Eyes: Negative for blurred vision, double vision, photophobia and pain.  Respiratory: Negative for cough, sputum production and shortness of breath.   Cardiovascular: Negative for chest pain.  Gastrointestinal: Positive for nausea, vomiting and abdominal pain.  Genitourinary: Negative for dysuria, urgency, frequency and flank pain.  Musculoskeletal: Negative for back pain, falls, joint pain, myalgias and neck pain.  Neurological: Negative for dizziness, tingling, sensory change, focal weakness, loss of consciousness and headaches.  Endo/Heme/Allergies: Does not bruise/bleed easily.  Psychiatric/Behavioral: Negative for depression, memory loss and substance abuse. The patient is not nervous/anxious.     Blood pressure 122/67, pulse 50, temperature 97.5 F (36.4 C), temperature source Oral, resp. rate 13, last menstrual period 08/14/2013, SpO2 98.00%, not currently breastfeeding. Physical Exam  WDWN in NAD HEENT:  EOMI, sclera anicteric Neck:  No masses, no thyromegaly Lungs:  CTA bilaterally; normal respiratory effort CV:  Regular rate and rhythm; no murmurs Abd:  +bowel sounds, soft, minimal RLQ tenderness Ext:  Well-perfused; no edema Skin:  Warm, dry; no sign of jaundice  Assessment/Plan Acute appendicitis  Admit for IV abx, NPO Laparoscopic appendectomy, possible open appendectomy by Dr. Redmond Pulling later this morning.  The surgical procedure has been discussed with the patient.  Potential risks, benefits, alternative treatments, and expected outcomes have been explained.  All of the patient's questions at this time have been answered.  The likelihood of reaching the patient's treatment goal is good.  The patient understand the proposed surgical procedure and wishes to proceed.   Kahle Mcqueen K. 08/25/2013, 4:41 AM

## 2013-08-25 NOTE — ED Notes (Signed)
Called Dr. Rubin PayorPickering and notified about pain.  He will place order for pain medication.

## 2013-08-25 NOTE — Transfer of Care (Signed)
Immediate Anesthesia Transfer of Care Note  Patient: Alexandria Vincent  Procedure(s) Performed: Procedure(s): APPENDECTOMY LAPAROSCOPIC (N/A)  Patient Location: PACU  Anesthesia Type:General  Level of Consciousness: awake, alert  and oriented  Airway & Oxygen Therapy: Patient Spontanous Breathing and Patient connected to nasal cannula oxygen  Post-op Assessment: Report given to PACU RN, Post -op Vital signs reviewed and stable and Patient moving all extremities  Post vital signs: Reviewed and stable  Complications: No apparent anesthesia complications

## 2013-08-25 NOTE — ED Notes (Signed)
Unable to locate Unasyn in pyxis.  Pharmacy notified and will send.

## 2013-08-25 NOTE — ED Notes (Signed)
Pt also c/o nausea and vomited x 1.  Zofran per protocol.

## 2013-08-25 NOTE — ED Notes (Signed)
Pt has finished PO contrast.  CT notified by NS

## 2013-08-25 NOTE — Interval H&P Note (Signed)
History and Physical Interval Note:  08/25/2013 7:48 AM  Alexandria Vincent  has presented today for surgery, with the diagnosis of acute appendicitis  The various methods of treatment have been discussed with the patient and family. After consideration of risks, benefits and other options for treatment, the patient has consented to  Procedure(s): APPENDECTOMY LAPAROSCOPIC (N/A) as a surgical intervention .  The patient's history has been reviewed, patient examined, no change in status, stable for surgery.  I have reviewed the patient's chart and labs.  Questions were answered to the patient's satisfaction.    We discussed laparoscopic appendectomy. We discussed the risk and benefits of surgery including but not limited to bleeding, infection, injury to surrounding structures, need to convert to an open procedure, blood clot formation, post operative abscess or wound infection, staple line complications such as leak or bleeding, hernia formation, post operative ileus, need for additional procedures, anesthesia complications, and the typical postoperative course. I explained that the patient should expect a good improvement in their symptoms.  Mary SellaEric M. Andrey CampanileWilson, MD, FACS General, Bariatric, & Minimally Invasive Surgery [facility] [place]   [facility] HospitalWILSON,[name]

## 2013-08-25 NOTE — Discharge Summary (Signed)
Patient ID: Alexandria Vincent MRN: [id number] DOB/AGE: [birth year] 32 y.o.  Admit date: 08/24/2013 Discharge date: 08/25/2013  Procedures: lap appy  Consults: None  Reason for Admission: This is a 33 yo female in good health who presents with several hours of acute generalized abdominal pain, nausea, vomiting, now localized pain in the RLQ. She reported subjective fever. She came to the ED for evaluation and was found to have appendicitis with no sign of perforation.  Admission Diagnoses:  1. Acute appendicitis  Hospital Course: The patient was admitted and taken to the OR where she underwent a lap appy.  She tolerated this procedure well.  On POD 0, she was eating and her pain was controlled.  She was stable for dc home if voiding well.  Discharge Diagnoses:  Active Problems:   Acute appendicitis s/p lap appy  Discharge Medications:   Medication List         flintstones complete 60 MG chewable tablet  Chew 2 tablets by mouth daily.     levonorgestrel 20 MCG/24HR IUD  Commonly known as:  MIRENA  1 each by Intrauterine route once.     oxyCODONE-acetaminophen 5-325 MG per tablet  Commonly known as:  PERCOCET/ROXICET  Take 1-2 tablets by mouth every 4 (four) hours as needed for moderate pain.     SUMAtriptan 100 MG tablet  Commonly known as:  IMITREX  Take 1 tablet (100 mg total) by mouth once as needed for migraine or headache. May repeat in 2 hours if headache persists or recurs.        Discharge Instructions:     Follow-up Information   Follow up with [facility] On 09/11/2013. (3:45pm, arrive at 3:15pm for paperwork)    Contact information:   [address]   [place] [address] [phone]       Signed: Letha CapeOSBORNE,KELLY E 08/25/2013, 3:56 PM

## 2013-08-25 NOTE — ED Notes (Signed)
Obtained urine sample from patient.

## 2013-08-25 NOTE — Anesthesia Procedure Notes (Signed)
Procedure Name: Intubation Date/Time: 08/25/2013 7:58 AM Performed by: Orvilla FusATO, Trennon Torbeck A Pre-anesthesia Checklist: Patient identified, Timeout performed, Emergency Drugs available, Suction available and Patient being monitored Patient Re-evaluated:Patient Re-evaluated prior to inductionOxygen Delivery Method: Circle system utilized Preoxygenation: Pre-oxygenation with 100% oxygen Intubation Type: IV induction, Rapid sequence and Cricoid Pressure applied Laryngoscope Size: Mac and 3 Grade View: Grade I Tube type: Oral Tube size: 7.0 mm Number of attempts: 1 Airway Equipment and Method: Stylet Placement Confirmation: ETT inserted through vocal cords under direct vision,  breath sounds checked- equal and bilateral and positive ETCO2 Secured at: 21 cm Tube secured with: Tape Dental Injury: Teeth and Oropharynx as per pre-operative assessment

## 2013-08-25 NOTE — Anesthesia Postprocedure Evaluation (Signed)
  Anesthesia Post-op Note  Patient: Alexandria Vincent  Procedure(s) Performed: Procedure(s): APPENDECTOMY LAPAROSCOPIC (N/A)  Patient Location: PACU  Anesthesia Type:General  Level of Consciousness: awake, alert , oriented and patient cooperative  Airway and Oxygen Therapy: Patient Spontanous Breathing  Post-op Pain: none  Post-op Assessment: Post-op Vital signs reviewed, Patient's Cardiovascular Status Stable, Respiratory Function Stable, Patent Airway, No signs of Nausea or vomiting and Pain level controlled  Post-op Vital Signs: Reviewed and stable  Last Vitals:  Filed Vitals:   08/25/13 1000  BP: 109/59  Pulse: 54  Temp:   Resp: 15    Complications: No apparent anesthesia complications

## 2013-08-26 LAB — CBC
HCT: 30.6 % — ABNORMAL LOW (ref 36.0–46.0)
HEMOGLOBIN: 9.9 g/dL — AB (ref 12.0–15.0)
MCH: 29.2 pg (ref 26.0–34.0)
MCHC: 32.4 g/dL (ref 30.0–36.0)
MCV: 90.3 fL (ref 78.0–100.0)
Platelets: 208 10*3/uL (ref 150–400)
RBC: 3.39 MIL/uL — AB (ref 3.87–5.11)
RDW: 12.4 % (ref 11.5–15.5)
WBC: 6.1 10*3/uL (ref 4.0–10.5)

## 2013-08-26 NOTE — Progress Notes (Signed)
Jolaine Clickarolyn Rings to be D/C'd Home per MD order.  Discussed with the patient and all questions fully answered.    Medication List         flintstones complete 60 MG chewable tablet  Chew 2 tablets by mouth daily.     levonorgestrel 20 MCG/24HR IUD  Commonly known as:  MIRENA  1 each by Intrauterine route once.     oxyCODONE-acetaminophen 5-325 MG per tablet  Commonly known as:  PERCOCET/ROXICET  Take 1-2 tablets by mouth every 4 (four) hours as needed for moderate pain.     SUMAtriptan 100 MG tablet  Commonly known as:  IMITREX  Take 1 tablet (100 mg total) by mouth once as needed for migraine or headache. May repeat in 2 hours if headache persists or recurs.        VSS, Skin clean, dry and intact without evidence of skin break down, no evidence of skin tears noted.  Surgical incision sites intact without sign of infection.  Patient able to void 700mL clear yellow urine prior to discharge. Received prescription for percocet.  IV catheter discontinued intact. Site without signs and symptoms of complications. Dressing and pressure applied.  An After Visit Summary was printed and given to the patient.  D/c education completed with patient/family including follow up instructions, medication list, d/c activities limitations if indicated, with other d/c instructions as indicated by MD - patient able to verbalize understanding, all questions fully answered.   Patient instructed to return to ED, call 911, or call MD for any changes in condition.   Patient escorted via WC, and D/C home via private auto.  Burt EkCook, Helen Cuff D 08/26/2013 11:14 AM

## 2013-08-27 ENCOUNTER — Telehealth (INDEPENDENT_AMBULATORY_CARE_PROVIDER_SITE_OTHER): Payer: Self-pay

## 2013-08-27 ENCOUNTER — Other Ambulatory Visit (INDEPENDENT_AMBULATORY_CARE_PROVIDER_SITE_OTHER): Payer: Self-pay

## 2013-08-27 DIAGNOSIS — G8918 Other acute postprocedural pain: Secondary | ICD-10-CM

## 2013-08-27 MED ORDER — ONDANSETRON HCL 4 MG PO TABS: 4.0000 mg | ORAL_TABLET | Freq: Four times a day (QID) | ORAL | Status: DC | PRN

## 2013-08-27 NOTE — Telephone Encounter (Addendum)
Patient calling into office to report that she's having nausea & vomiting since late yesterday evening.  Patient denies having any abdominal pains, fevers or chills.  Patient reports that she's having a Migraine headache that is causing her a lot of pain.  Patient states her son is sick with a bug today and she's not sure if this is the cause.  Patient states she's unable to keep foods down.  Patient reports that she has been drinking Pedialyte to stay hydrated.  Patient status post Laparoscopic Appendectomy on 08/25/13.  Patient requesting a prescription for Nausea medication.  Will Page discharging PA and review.

## 2013-08-27 NOTE — Telephone Encounter (Signed)
Pt's husband called back with concern that the pt is still having nausea, vomiting, and is hurting in her abdomen.  I advised that Alexandria Vincent, CMA has already called in the Nausea mediation.  Husband advised me that he was aware of this.  I asked if pt has tried the medication at this time and he advised me that they have not picked the medication up yet.  I advised them to try the medication and if pt wasn't any better by tomorrow, then call the office back about maybe seeing our URGE dr.  Pt's husband verbalized understanding.  Victorino DikeJennifer

## 2013-08-27 NOTE — Telephone Encounter (Signed)
Reviewed with Tresa EndoKelly, PA and was instructed that patient needs to be seen today for further assessment.  I informed Tresa EndoKelly that the patient declined to come to the office due to the appointment time being late in the day.  Received verbal order for prescription for nausea medication.  Called patient and spoke with husband.  Patient currently laying down.  [facility] [place] on [address].  Patients spouse advised to call our office or take patient to the ER if her pain cannot be controlled with pain medication, fever over 101.0, increased pain, redness or drainage from the incision or increasing abdominal pain.  Patient spouse verbalized understanding and agrees with above.

## 2013-08-27 NOTE — Addendum Note (Signed)
Addended by: Maryan PulsMOORE, Raziyah Vanvleck on: 08/27/2013 03:01 PM   Modules accepted: Orders

## 2013-08-27 NOTE — Telephone Encounter (Signed)
Patients mother calling into the office concerned with the nausea and vomiting she's .  Advised patients mother that she needs to come in our office to see the Urgent Office Physician today @ 4:00 pm for further assessment.  I then spoke to the patient and recommended that she come in today to be seen in Urgent Office @ 4pm.  Patient declines the appointment.  I advised patient that I spoke to MedillKelly, [place] and that it's recommended that she's seen today for further evaluation.  Patient refuses to come in and is requesting a prescription for Nausea medication.  Advised patient that I will page PA and call back.  Patient verbalized understanding.

## 2013-08-27 NOTE — Discharge Summary (Signed)
Onna Nodal M. Lynasia Meloche, MD, FACS General, Bariatric, & Minimally Invasive Surgery [facility]  

## 2013-08-27 NOTE — Telephone Encounter (Signed)
Agree with above documentation.  Encouraged patient multiple times to come in to the urgent office to be evaluated for her nausea and vomiting, given she was only 2 days post-op.  The patient declined to be seen.  OSBORNE,KELLY E 08/27/2013 3:34 PM

## 2013-08-28 ENCOUNTER — Emergency Department (HOSPITAL_COMMUNITY): Payer: BC Managed Care – PPO

## 2013-08-28 ENCOUNTER — Encounter (HOSPITAL_COMMUNITY): Payer: Self-pay | Admitting: General Surgery

## 2013-08-28 ENCOUNTER — Inpatient Hospital Stay (HOSPITAL_COMMUNITY)
Admission: EM | Admit: 2013-08-28 | Discharge: 2013-08-29 | DRG: 389 | Disposition: A | Discharge status: Home / Self Care | Payer: BC Managed Care – PPO | Attending: General Surgery | Admitting: General Surgery

## 2013-08-28 DIAGNOSIS — K560 Paralytic ileus: Principal | ICD-10-CM | POA: Diagnosis present

## 2013-08-28 DIAGNOSIS — K929 Disease of digestive system, unspecified: Secondary | ICD-10-CM

## 2013-08-28 DIAGNOSIS — G43109 Migraine with aura, not intractable, without status migrainosus: Secondary | ICD-10-CM | POA: Diagnosis present

## 2013-08-28 DIAGNOSIS — K567 Ileus, unspecified: Secondary | ICD-10-CM

## 2013-08-28 DIAGNOSIS — K9189 Other postprocedural complications and disorders of digestive system: Secondary | ICD-10-CM

## 2013-08-28 DIAGNOSIS — Y838 Other surgical procedures as the cause of abnormal reaction of the patient, or of later complication, without mention of misadventure at the time of the procedure: Secondary | ICD-10-CM | POA: Diagnosis present

## 2013-08-28 DIAGNOSIS — Z833 Family history of diabetes mellitus: Secondary | ICD-10-CM

## 2013-08-28 DIAGNOSIS — K589 Irritable bowel syndrome without diarrhea: Secondary | ICD-10-CM | POA: Diagnosis present

## 2013-08-28 DIAGNOSIS — F411 Generalized anxiety disorder: Secondary | ICD-10-CM | POA: Diagnosis present

## 2013-08-28 HISTORY — DX: Migraine, unspecified, not intractable, without status migrainosus: G43.909

## 2013-08-28 HISTORY — DX: Conduction disorder, unspecified: I45.9

## 2013-08-28 LAB — COMPREHENSIVE METABOLIC PANEL
ALBUMIN: 3.5 g/dL (ref 3.5–5.2)
ALT: 24 U/L (ref 0–35)
AST: 37 U/L (ref 0–37)
Alkaline Phosphatase: 47 U/L (ref 39–117)
BUN: 11 mg/dL (ref 6–23)
CO2: 25 mEq/L (ref 19–32)
CREATININE: 0.88 mg/dL (ref 0.50–1.10)
Calcium: 9.1 mg/dL (ref 8.4–10.5)
Chloride: 105 mEq/L (ref 96–112)
GFR calc Af Amer: >90 mL/min (ref >90)
GFR calc non Af Amer: 87 mL/min — ABNORMAL LOW (ref >90)
Glucose, Bld: 89 mg/dL (ref 70–99)
Potassium: 4 mEq/L (ref 3.7–5.3)
Sodium: 143 mEq/L (ref 137–147)
TOTAL PROTEIN: 6.9 g/dL (ref 6.0–8.3)
Total Bilirubin: 0.2 mg/dL — ABNORMAL LOW (ref 0.3–1.2)

## 2013-08-28 LAB — CBC WITH DIFFERENTIAL/PLATELET
BASOS PCT: 0 % (ref 0–1)
Basophils Absolute: 0 10*3/uL (ref 0.0–0.1)
EOS ABS: 0.2 10*3/uL (ref 0.0–0.7)
EOS PCT: 3 % (ref 0–5)
HEMATOCRIT: 34.7 % — AB (ref 36.0–46.0)
Hemoglobin: 11 g/dL — ABNORMAL LOW (ref 12.0–15.0)
Lymphocytes Relative: 25 % (ref 12–46)
Lymphs Abs: 1.5 10*3/uL (ref 0.7–4.0)
MCH: 28.4 pg (ref 26.0–34.0)
MCHC: 31.7 g/dL (ref 30.0–36.0)
MCV: 89.4 fL (ref 78.0–100.0)
MONO ABS: 0.5 10*3/uL (ref 0.1–1.0)
Monocytes Relative: 8 % (ref 3–12)
Neutro Abs: 4 10*3/uL (ref 1.7–7.7)
Neutrophils Relative %: 64 % (ref 43–77)
Platelets: 292 10*3/uL (ref 150–400)
RBC: 3.88 MIL/uL (ref 3.87–5.11)
RDW: 12 % (ref 11.5–15.5)
WBC: 6.1 10*3/uL (ref 4.0–10.5)

## 2013-08-28 LAB — URINALYSIS, ROUTINE W REFLEX MICROSCOPIC
Bilirubin Urine: NEGATIVE
Glucose, UA: NEGATIVE mg/dL
HGB URINE DIPSTICK: NEGATIVE
Ketones, ur: NEGATIVE mg/dL
Nitrite: NEGATIVE
Protein, ur: NEGATIVE mg/dL
Specific Gravity, Urine: 1.016 (ref 1.005–1.030)
UROBILINOGEN UA: 1 mg/dL (ref 0.0–1.0)
pH: 6.5 (ref 5.0–8.0)

## 2013-08-28 LAB — URINE MICROSCOPIC-ADD ON

## 2013-08-28 LAB — LIPASE, BLOOD: Lipase: 30 U/L (ref 11–59)

## 2013-08-28 LAB — LACTIC ACID, PLASMA: LACTIC ACID, VENOUS: 0.6 mmol/L (ref 0.5–2.2)

## 2013-08-28 MED ORDER — HYDROMORPHONE HCL PF 1 MG/ML IJ SOLN
0.5000 mg | Freq: Once | INTRAMUSCULAR | Status: AC
  Administered 2013-08-28: 0.5 mg via INTRAVENOUS
  Filled 2013-08-28: qty 1

## 2013-08-28 MED ORDER — POLYETHYLENE GLYCOL 3350 17 G PO PACK
17.0000 g | PACK | Freq: Every day | ORAL | Status: DC
  Administered 2013-08-29: 17 g via ORAL
  Filled 2013-08-28: qty 1

## 2013-08-28 MED ORDER — ONDANSETRON HCL 4 MG/2ML IJ SOLN
4.0000 mg | Freq: Once | INTRAMUSCULAR | Status: AC
  Administered 2013-08-28: 4 mg via INTRAVENOUS
  Filled 2013-08-28: qty 2

## 2013-08-28 MED ORDER — ENOXAPARIN SODIUM 40 MG/0.4ML ~~LOC~~ SOLN
40.0000 mg | Freq: Every day | SUBCUTANEOUS | Status: DC
  Administered 2013-08-28: 40 mg via SUBCUTANEOUS
  Filled 2013-08-28 (×2): qty 0.4

## 2013-08-28 MED ORDER — ONDANSETRON HCL 4 MG/2ML IJ SOLN
4.0000 mg | Freq: Four times a day (QID) | INTRAMUSCULAR | Status: DC | PRN
  Administered 2013-08-29: 4 mg via INTRAVENOUS
  Filled 2013-08-28: qty 2

## 2013-08-28 MED ORDER — SODIUM CHLORIDE 0.9 % IV BOLUS (SEPSIS)
1000.0000 mL | Freq: Once | INTRAVENOUS | Status: AC
  Administered 2013-08-28: 1000 mL via INTRAVENOUS

## 2013-08-28 MED ORDER — POTASSIUM CHLORIDE IN NACL 20-0.9 MEQ/L-% IV SOLN
INTRAVENOUS | Status: DC
  Administered 2013-08-29 (×3): via INTRAVENOUS
  Filled 2013-08-28 (×5): qty 1000

## 2013-08-28 MED ORDER — OXYCODONE HCL 5 MG PO TABS: 5.0000 mg | ORAL_TABLET | ORAL | Status: DC | PRN

## 2013-08-28 MED ORDER — HYDROMORPHONE HCL PF 1 MG/ML IJ SOLN
1.0000 mg | INTRAMUSCULAR | Status: DC | PRN
  Administered 2013-08-28 – 2013-08-29 (×3): 1 mg via INTRAVENOUS
  Filled 2013-08-28 (×3): qty 1

## 2013-08-28 MED ORDER — BISACODYL 10 MG RE SUPP
10.0000 mg | Freq: Once | RECTAL | Status: AC
  Administered 2013-08-28: 10 mg via RECTAL
  Filled 2013-08-28: qty 1

## 2013-08-28 NOTE — ED Notes (Signed)
Pt presents to department for evaluation of fever, nausea/vomiting and fatigue. Pt has appendix removed by Dr. Andrey CampanileWilson on Saturday. States after surgery she developed fever, headache and started vomiting. No relief of symptoms. Denies pain at the time. Pt is alert and oriented x4.

## 2013-08-28 NOTE — ED Provider Notes (Signed)
CSN: [id number]     Arrival date & time 08/28/13  1643 History   First MD Initiated Contact with Patient 08/28/13 1711     Chief Complaint  Patient presents with  . Fever  . Nausea  . Emesis     HPI  Patient presents with concern of nausea, vomiting, fever.  Patient had appendectomy 3 days ago. Since discharge, patient continues to have fever, nausea, vomiting, anorexia. She continues to have good urine output, but to no bowel movements since discharge. She also has a mild headache, diffuse, typical for prior migraines. She denies confusion, disorientation.    Past Medical History  Diagnosis Date  . Anxiety   . IBS (irritable bowel syndrome)   . Abnormal Pap smear 2011    asc hpv  . Sinus infection 05/18/2012  . 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 IUD (intrauterine device) in place 01/16/2013   Past Surgical History  Procedure Laterality Date  . No past surgeries    . Laparoscopic appendectomy N/A 08/25/2013    Procedure: APPENDECTOMY LAPAROSCOPIC;  Surgeon: Atilano InaEric M Wilson, MD;  Location[facility] OR;  Service: General;  Laterality: N/A;   Family History  Problem Relation Age of Onset  . Cancer Maternal Grandfather     lung, bone  . Diabetes Maternal Grandfather    History  Substance Use Topics  . Smoking status: Never Smoker   . Smokeless tobacco: Never Used  . Alcohol Use: Yes     Comment: 1-2 drinks a week; wine once a week   OB History   Grav Para Term Preterm Abortions TAB SAB Ect Mult Living   2 2 2       2      Review of Systems  Constitutional:       Per HPI, otherwise negative  HENT:       Per HPI, otherwise negative  Respiratory:       Per HPI, otherwise negative  Cardiovascular:       Per HPI, otherwise negative  Gastrointestinal: Positive for vomiting, abdominal pain and constipation.  Endocrine:       Negative aside from HPI  Genitourinary:       Neg aside from HPI   Musculoskeletal:       Per HPI, otherwise negative  Skin: Negative.    Neurological: Positive for headaches. Negative for syncope.      Allergies  Other  Home Medications   Prior to Admission medications   Medication Sig Start Date End Date Taking? Authorizing Provider  flintstones complete (FLINTSTONES) 60 MG chewable tablet Chew 2 tablets by mouth daily.    Yes Historical Provider, MD  levonorgestrel (MIRENA) 20 MCG/24HR IUD 1 each by Intrauterine route once.   Yes Historical Provider, MD  ondansetron (ZOFRAN) 4 MG tablet Take 1 tablet (4 mg total) by mouth every 6 (six) hours as needed for nausea or vomiting. 08/27/13  Yes Letha CapeKelly E Osborne, PA-C  oxyCODONE-acetaminophen (PERCOCET/ROXICET) 5-325 MG per tablet Take 1-2 tablets by mouth every 4 (four) hours as needed for moderate pain. 08/25/13  Yes Letha CapeKelly E Osborne, PA-C  SUMAtriptan (IMITREX) 100 MG tablet Take 1 tablet (100 mg total) by mouth once as needed for migraine or headache. May repeat in 2 hours if headache persists or recurs. 08/01/13  Yes Scarlette CalicoFrances Cresenzo-Dishmon, CNM   BP 124/80  Pulse 55  Temp(Src) 97.8 F (36.6 C) (Oral)  Resp 18  SpO2 100%  LMP 08/14/2013 Physical Exam  Nursing note and vitals reviewed. Constitutional: She  is oriented to person, place, and time. She appears well-developed and well-nourished. No distress.  HENT:  Head: Normocephalic and atraumatic.  Eyes: Conjunctivae and EOM are normal.  Cardiovascular: Normal rate and regular rhythm.   Pulmonary/Chest: Effort normal and breath sounds normal. No stridor. No respiratory distress.  Abdominal: She exhibits no distension.  Soft, non-peritoneal, no guarding. Surgical scars all c/d/i  w no erythema, drainage, discharge  Musculoskeletal: She exhibits no edema.  Neurological: She is alert and oriented to person, place, and time. No cranial nerve deficit.  Skin: Skin is warm and dry.  Psychiatric: She has a normal mood and affect.    ED Course  Procedures (including critical care time) Labs Review Labs Reviewed  CBC  WITH DIFFERENTIAL - Abnormal; Notable for the following:    Hemoglobin 11.0 (*)    HCT 34.7 (*)    All other components within normal limits  COMPREHENSIVE METABOLIC PANEL - Abnormal; Notable for the following:    Total Bilirubin 0.2 (*)    GFR calc non Af Amer 87 (*)    All other components within normal limits  URINALYSIS, ROUTINE W REFLEX MICROSCOPIC - Abnormal; Notable for the following:    APPearance HAZY (*)    Leukocytes, UA SMALL (*)    All other components within normal limits  URINE MICROSCOPIC-ADD ON - Abnormal; Notable for the following:    Squamous Epithelial / LPF MANY (*)    Bacteria, UA FEW (*)    All other components within normal limits  LIPASE, BLOOD  LACTIC ACID, PLASMA    Imaging Review Dg Abd Acute W/chest  08/28/2013   CLINICAL DATA:  Abdominal pain with fever, nausea and vomiting status post appendectomy.  EXAM: ACUTE ABDOMEN SERIES (ABDOMEN 2 VIEW & CHEST 1 VIEW)  COMPARISON:  CT 08/25/2013.  Portable chest 06/01/2007.  FINDINGS: The heart size and mediastinal contours are stable. The lungs are clear. There is no pleural effusion or pneumothorax.  The bowel gas pattern is nonobstructive. There is fluid within the stomach and residual contrast material within the right colon. A small amount of free intraperitoneal air is attributed to recent abdominal surgery. Nonobstructing calculus in the lower pole of the right kidney and intrauterine device are noted.  IMPRESSION: Minimal pneumoperitoneum attributed to recent abdominal surgery. No evidence of bowel obstruction or other acute process.   Electronically Signed   By: Roxy HorsemanBill  Veazey M.D.   On: 08/28/2013 18:51    I reviewed the EMR  7:47 PM Patient continues to have nausea, pain in spite of initial meds, fluid bolus. Reviewed all findings with her and her husband. Subsequently discussed patient's case with our surgical colleagues.  MDM   Patient presents 3 days after appendectomy with ongoing abdominal pain,  fever, vomiting. Patient's physical exam a somewhat reassuring, and she is afebrile here, with a non-peritoneal abdomen, but with ongoing pain, lack of bowel movements, persistent nausea, patient was admitted to the surgical team for further evaluation and management.   Gerhard Munchobert Lockwood, MD 08/28/13 [id number]

## 2013-08-28 NOTE — Telephone Encounter (Signed)
Pt is calling stating that she has taken 2 Tylenol and her fever has continued to remain 101.4. Spoke with Pattricia Bossnnie, she states to have the pt go to the ER. Advised pt to go to either [facility] or [facility] ER. Pt verbalized understanding.

## 2013-08-28 NOTE — H&P (Signed)
Alexandria Vincent is an 33 y.o. female.   Chief Complaint: postop abdominal pain, nausea, vomiting HPI: this is a pleasant 33 year old female who is status post a laparoscopic appendectomy on June 27. She was discharged on the following morning. She has not had a bowel movement since discharge. She has had cramping abdominal pain with nausea and vomiting today as well as low-grade fevers. She also has a moderate to severe headache.  Past Medical History  Diagnosis Date  . Anxiety   . IBS (irritable bowel syndrome)   . Abnormal Pap smear 2011    asc hpv  . Sinus infection 05/18/2012  . 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 IUD (intrauterine device) in place 01/16/2013    Past Surgical History  Procedure Laterality Date  . No past surgeries    . Laparoscopic appendectomy N/A 08/25/2013    Procedure: APPENDECTOMY LAPAROSCOPIC;  Surgeon: Gayland Curry, MD;  Location: [facility] OR;  Service: General;  Laterality: N/A;    Family History  Problem Relation Age of Onset  . Cancer Maternal Grandfather     lung, bone  . Diabetes Maternal Grandfather    Social History:  reports that she has never smoked. She has never used smokeless tobacco. She reports that she drinks alcohol. She reports that she does not use illicit drugs.  Allergies:  Allergies  Allergen Reactions  . Other Swelling    Walnuts cause angio edema     (Not in a hospital admission)  Results for orders placed during the hospital encounter of 08/28/13 (from the past 48 hour(s))  CBC WITH DIFFERENTIAL     Status: Abnormal   Collection Time    08/28/13  5:20 PM      Result Value Ref Range   WBC 6.1  4.0 - 10.5 K/uL   RBC 3.88  3.87 - 5.11 MIL/uL   Hemoglobin 11.0 (*) 12.0 - 15.0 g/dL   HCT 34.7 (*) 36.0 - 46.0 %   MCV 89.4  78.0 - 100.0 fL   MCH 28.4  26.0 - 34.0 pg   MCHC 31.7  30.0 - 36.0 g/dL   RDW 12.0  11.5 - 15.5 %   Platelets 292  150 - 400 K/uL   Neutrophils Relative % 64  43 - 77 %   Neutro Abs 4.0  1.7 - 7.7 K/uL    Lymphocytes Relative 25  12 - 46 %   Lymphs Abs 1.5  0.7 - 4.0 K/uL   Monocytes Relative 8  3 - 12 %   Monocytes Absolute 0.5  0.1 - 1.0 K/uL   Eosinophils Relative 3  0 - 5 %   Eosinophils Absolute 0.2  0.0 - 0.7 K/uL   Basophils Relative 0  0 - 1 %   Basophils Absolute 0.0  0.0 - 0.1 K/uL  COMPREHENSIVE METABOLIC PANEL     Status: Abnormal   Collection Time    08/28/13  5:20 PM      Result Value Ref Range   Sodium 143  137 - 147 mEq/L   Potassium 4.0  3.7 - 5.3 mEq/L   Chloride 105  96 - 112 mEq/L   CO2 25  19 - 32 mEq/L   Glucose, Bld 89  70 - 99 mg/dL   BUN 11  6 - 23 mg/dL   Creatinine, Ser 0.88  0.50 - 1.10 mg/dL   Calcium 9.1  8.4 - 10.5 mg/dL   Total Protein 6.9  6.0 - 8.3 g/dL   Albumin 3.5  3.5 - 5.2 g/dL   AST 37  0 - 37 U/L   ALT 24  0 - 35 U/L   Alkaline Phosphatase 47  39 - 117 U/L   Total Bilirubin 0.2 (*) 0.3 - 1.2 mg/dL   GFR calc non Af Amer 87 (*) >90 mL/min   GFR calc Af Amer >90  >90 mL/min   Comment: (NOTE)     The eGFR has been calculated using the CKD EPI equation.     This calculation has not been validated in all clinical situations.     eGFR's persistently <90 mL/min signify possible Chronic Kidney     Disease.  LIPASE, BLOOD     Status: None   Collection Time    08/28/13  5:20 PM      Result Value Ref Range   Lipase 30  11 - 59 U/L  LACTIC ACID, PLASMA     Status: None   Collection Time    08/28/13  6:12 PM      Result Value Ref Range   Lactic Acid, Venous 0.6  0.5 - 2.2 mmol/L  URINALYSIS, ROUTINE W REFLEX MICROSCOPIC     Status: Abnormal   Collection Time    08/28/13  6:50 PM      Result Value Ref Range   Color, Urine YELLOW  YELLOW   APPearance HAZY (*) CLEAR   Specific Gravity, Urine 1.016  1.005 - 1.030   pH 6.5  5.0 - 8.0   Glucose, UA NEGATIVE  NEGATIVE mg/dL   Hgb urine dipstick NEGATIVE  NEGATIVE   Bilirubin Urine NEGATIVE  NEGATIVE   Ketones, ur NEGATIVE  NEGATIVE mg/dL   Protein, ur NEGATIVE  NEGATIVE mg/dL    Urobilinogen, UA 1.0  0.0 - 1.0 mg/dL   Nitrite NEGATIVE  NEGATIVE   Leukocytes, UA SMALL (*) NEGATIVE  URINE MICROSCOPIC-ADD ON     Status: Abnormal   Collection Time    08/28/13  6:50 PM      Result Value Ref Range   Squamous Epithelial / LPF MANY (*) RARE   WBC, UA 3-6  <3 WBC/hpf   RBC / HPF 0-2  <3 RBC/hpf   Bacteria, UA FEW (*) RARE   Dg Abd Acute W/chest  08/28/2013   CLINICAL DATA:  Abdominal pain with fever, nausea and vomiting status post appendectomy.  EXAM: ACUTE ABDOMEN SERIES (ABDOMEN 2 VIEW & CHEST 1 VIEW)  COMPARISON:  CT 08/25/2013.  Portable chest 06/01/2007.  FINDINGS: The heart size and mediastinal contours are stable. The lungs are clear. There is no pleural effusion or pneumothorax.  The bowel gas pattern is nonobstructive. There is fluid within the stomach and residual contrast material within the right colon. A small amount of free intraperitoneal air is attributed to recent abdominal surgery. Nonobstructing calculus in the lower pole of the right kidney and intrauterine device are noted.  IMPRESSION: Minimal pneumoperitoneum attributed to recent abdominal surgery. No evidence of bowel obstruction or other acute process.   Electronically Signed   By: Roxy Horseman M.D.   On: 08/28/2013 18:51    Review of Systems  All other systems reviewed and are negative.   Blood pressure 127/74, pulse 52, temperature 97.8 F (36.6 C), temperature source Oral, resp. rate 13, last menstrual period 08/14/2013, SpO2 100.00%, not currently breastfeeding. Physical Exam  Constitutional: She is oriented to person, place, and time. She appears well-developed and well-nourished. She appears distressed.  She is uncomfortable in appearance  HENT:  Head: Normocephalic  and atraumatic.  Right Ear: External ear normal.  Left Ear: External ear normal.  Nose: Nose normal.  Eyes: Conjunctivae are normal. Pupils are equal, round, and reactive to light. No scleral icterus.  Cardiovascular: Normal  rate, regular rhythm and normal heart sounds.   Respiratory: Effort normal and breath sounds normal. No respiratory distress.  GI: Soft. She exhibits no distension. There is tenderness. There is guarding.  Her abdomen is soft and nondistended. There is tenderness in the right lower quadrant with guarding. This seems appropriate given the recent surgery  Musculoskeletal: Normal range of motion.  Neurological: She is alert and oriented to person, place, and time.  Skin: Skin is warm and dry. She is not diaphoretic.  Psychiatric: Her behavior is normal. Judgment normal.   no obvious incisional hernia  Assessment/Plan Postoperative ileus status post laparoscopic appendectomy  Plan will be to place her in the hospital for observation. I will give her both MiraLAX and the block suppository and manage her pain with IV medications. Her abdominal x-ray shows no distended loops of small bowel although there may be an air-fluid level. There is still contrast in her right colon from the CAT scan from 3 days ago consistent with an ileus.  Hopefully she will improve with this as well as IV rehydration. If she does not improve, she may need another CAT scan of the abdomen and pelvis.  BLACKMAN,DOUGLAS A 08/28/2013, 7:47 PM

## 2013-08-28 NOTE — Telephone Encounter (Signed)
Pt states that her n/v has gotten better. Pt states that she has had a fever of 100.0.  Pt denies any redness, swelling, or tenderness at the incision site. Pt states that she has not taking any Tylenol due to her taking the Percocet. Pt states that she has wanted to stop taking the Percocet because she has not had any pain. Advised pt that she can take up to 800mg  Ibuprofen every 8 hours and Tylenol as needed to help with fevers. Advised pt that if fever has not gone down to give us a call back/ Pt verbalized understanding and agrees with POC.

## 2013-08-28 NOTE — ED Notes (Signed)
Patient transported to X-ray 

## 2013-08-29 DIAGNOSIS — K929 Disease of digestive system, unspecified: Secondary | ICD-10-CM

## 2013-08-29 DIAGNOSIS — G43109 Migraine with aura, not intractable, without status migrainosus: Secondary | ICD-10-CM

## 2013-08-29 LAB — CBC
HCT: 30 % — ABNORMAL LOW (ref 36.0–46.0)
HEMOGLOBIN: 9.6 g/dL — AB (ref 12.0–15.0)
MCH: 28.2 pg (ref 26.0–34.0)
MCHC: 32 g/dL (ref 30.0–36.0)
MCV: 88.2 fL (ref 78.0–100.0)
Platelets: 262 10*3/uL (ref 150–400)
RBC: 3.4 MIL/uL — ABNORMAL LOW (ref 3.87–5.11)
RDW: 12.1 % (ref 11.5–15.5)
WBC: 6.6 10*3/uL (ref 4.0–10.5)

## 2013-08-29 MED ORDER — WHITE PETROLATUM GEL
Status: AC
Start: 2013-08-29 — End: 2013-08-29
  Administered 2013-08-29: 1
  Filled 2013-08-29: qty 5

## 2013-08-29 MED ORDER — POLYETHYLENE GLYCOL 3350 17 G PO PACK
17.0000 g | PACK | Freq: Once | ORAL | Status: AC
  Administered 2013-08-29: 17 g via ORAL
  Filled 2013-08-29: qty 1

## 2013-08-29 MED ORDER — ACETAMINOPHEN 325 MG PO TABS
650.0000 mg | ORAL_TABLET | Freq: Four times a day (QID) | ORAL | Status: DC | PRN
  Administered 2013-08-29 (×2): 650 mg via ORAL
  Filled 2013-08-29 (×2): qty 2

## 2013-08-29 MED ORDER — KETOROLAC TROMETHAMINE 15 MG/ML IJ SOLN
15.0000 mg | Freq: Once | INTRAMUSCULAR | Status: AC
  Administered 2013-08-29: 15 mg via INTRAVENOUS
  Filled 2013-08-29: qty 1

## 2013-08-29 MED ORDER — ENSURE COMPLETE PO LIQD: 237.0000 mL | Freq: Every day | ORAL | Status: DC

## 2013-08-29 MED ORDER — KETOROLAC TROMETHAMINE 15 MG/ML IJ SOLN: 15.0000 mg | Freq: Three times a day (TID) | INTRAMUSCULAR | Status: DC | PRN

## 2013-08-29 NOTE — Progress Notes (Signed)
INITIAL NUTRITION ASSESSMENT  DOCUMENTATION CODES Per approved criteria  -Not Applicable   INTERVENTION: Ensure Complete po dialy, each supplement provides 350 kcal and 13 grams of protein RD to follow for nutrition care plan  NUTRITION DIAGNOSIS: Inadequate oral intake related to decreased appetite as evidenced by pt report  Goal: Pt to meet >/= 90% of their estimated nutrition needs   Monitor:  PO & supplemental intake, weight, labs, I/O's  Reason for Assessment: Malnutrition Screening Tool Report  33 y.o. female  Admitting Dx: post-op abdominal pain, N/V  ASSESSMENT: 33 year old Female with PMH of IBS; presented with cramping abdominal pain, nausea and vomiting as well as low-grade fevers and headache.  Patient s/p procedure 6/27: LAPAROSCOPIC APPENDECTOMY  Patient reports her PO intake has been limited since last Friday, 6/26.  No reported recent weight loss.  States she is willing to "try anything" to have a bowel movement as it's the only thing that's keeping her here.  Amenable to trying Ensure Complete supplement.  Height: Ht Readings from Last 1 Encounters:  08/28/13 5\' 11"  (1.803 m)    Weight: Wt Readings from Last 1 Encounters:  08/28/13 155 lb 12.8 oz (70.67 kg)    Ideal Body Weight: 155 lb  % Ideal Body Weight: 100%  Wt Readings from Last 10 Encounters:  08/28/13 155 lb 12.8 oz (70.67 kg)  08/25/13 145 lb (65.772 kg)  08/25/13 145 lb (65.772 kg)  08/01/13 153 lb (69.4 kg)  01/16/13 164 lb (74.39 kg)  12/13/12 162 lb 8 oz (73.71 kg)  12/04/12 160 lb (72.576 kg)  11/09/12 160 lb (72.576 kg)  10/09/12 165 lb (74.844 kg)  09/28/12 177 lb (80.287 kg)    Usual Body Weight: 164 lb -- November 2014  % Usual Body Weight: 94%  BMI:  Body mass index is 21.74 kg/(m^2).  Estimated Nutritional Needs: Kcal: 1800-2000 Protein: 90-100 gm Fluid: 1.8-2.0 L  Skin: Intact  Diet Order: Full Liquid  EDUCATION NEEDS: -No education needs identified at  this time   Intake/Output Summary (Last 24 hours) at 08/29/13 1329 Last data filed at 08/29/13 0932  Gross per 24 hour  Intake 719.17 ml  Output    500 ml  Net 219.17 ml    Labs:   Recent Labs Lab 08/24/13 2256 08/28/13 1720  NA 142 143  K 4.5 4.0  CL 102 105  CO2 21 25  BUN 21 11  CREATININE 0.81 0.88  CALCIUM 9.8 9.1  GLUCOSE 99 89    Scheduled Meds: . enoxaparin (LOVENOX) injection  40 mg Subcutaneous QHS  . polyethylene glycol  17 g Oral Daily    Continuous Infusions: . 0.9 % NaCl with KCl 20 mEq / L 125 mL/hr at 08/29/13 [id number]    Past Medical History  Diagnosis Date  . IBS (irritable bowel syndrome)   . Abnormal Pap smear 2011    asc hpv  . Sinus infection 05/18/2012  . IUD (intrauterine device) in place 01/16/2013  . Skipped heart beats   . Migraine     "twice/month recently" (08/28/2013)    Past Surgical History  Procedure Laterality Date  . Laparoscopic appendectomy N/A 08/25/2013    Procedure: APPENDECTOMY LAPAROSCOPIC;  Surgeon: Atilano InaEric M Wilson, MD;  [facility];  Service: General;  Laterality: N/A;  . Appendectomy  08/25/2013    laparoscopic    Maureen ChattersKatie Oree Mirelez, RD, LDN Pager #: [phone] After-Hours Pager #: [phone]

## 2013-08-29 NOTE — Progress Notes (Signed)
Patient ID: Alexandria Vincent, female   DOB: 05-Jun-1980, 33 y.o.   MRN: [id number]   Subjective: Had a small BM, no abdominal pain.  Nausea has improved.  Still has a headache with aura.  Hx of chronic migraines followed by pcp.  Goody powders and imiitrex have worked in the past, took imitrex the last few days but unable to keep it down.  Denies visual changes or symptoms to warrant further evaluation.   Objective:  Vital signs:  Filed Vitals:   08/28/13 2015 08/28/13 2030 08/28/13 2059 08/29/13 0516  BP: 127/75 132/78 135/72 111/62  Pulse: 51 54 55 58  Temp:   97.6 F (36.4 C) 97.7 F (36.5 C)  TempSrc:   Oral Oral  Resp:   15 20  Height:   '5\' 11"'$  (1.803 m)   Weight:   155 lb 12.8 oz (70.67 kg)   SpO2: 99% 97% 100% 100%    Last BM Date: 08/29/13  Intake/Output   Yesterday:  06/30 0701 - 07/01 0700 In: 479.2 [I.V.:479.2] Out: 500 [Urine:500] This shift:    I/O last 3 completed shifts: In: 479.2 [I.V.:479.2] Out: 500 [Urine:500]    Physical Exam: General: Pt awake/alert/oriented x4 in mild acute distress Chest: cta.  No chest wall pain w good excursion CV:  Pulses intact.  Regular rhythm MS: Normal AROM mjr joints.  No obvious deformity Abdomen: Soft.  Nondistended.  Non tender.  Incisions are c/d/i. No evidence of peritonitis.  No incarcerated hernias. Ext:  SCDs BLE.  No mjr edema.  No cyanosis Skin: No petechiae / purpura   Problem List:   Active Problems:   Postoperative ileus    Results:   Labs: Results for orders placed during the hospital encounter of 08/28/13 (from the past 48 hour(s))  CBC WITH DIFFERENTIAL     Status: Abnormal   Collection Time    08/28/13  5:20 PM      Result Value Ref Range   WBC 6.1  4.0 - 10.5 K/uL   RBC 3.88  3.87 - 5.11 MIL/uL   Hemoglobin 11.0 (*) 12.0 - 15.0 g/dL   HCT 34.7 (*) 36.0 - 46.0 %   MCV 89.4  78.0 - 100.0 fL   MCH 28.4  26.0 - 34.0 pg   MCHC 31.7  30.0 - 36.0 g/dL   RDW 12.0  11.5 - 15.5 %   Platelets  292  150 - 400 K/uL   Neutrophils Relative % 64  43 - 77 %   Neutro Abs 4.0  1.7 - 7.7 K/uL   Lymphocytes Relative 25  12 - 46 %   Lymphs Abs 1.5  0.7 - 4.0 K/uL   Monocytes Relative 8  3 - 12 %   Monocytes Absolute 0.5  0.1 - 1.0 K/uL   Eosinophils Relative 3  0 - 5 %   Eosinophils Absolute 0.2  0.0 - 0.7 K/uL   Basophils Relative 0  0 - 1 %   Basophils Absolute 0.0  0.0 - 0.1 K/uL  COMPREHENSIVE METABOLIC PANEL     Status: Abnormal   Collection Time    08/28/13  5:20 PM      Result Value Ref Range   Sodium 143  137 - 147 mEq/L   Potassium 4.0  3.7 - 5.3 mEq/L   Chloride 105  96 - 112 mEq/L   CO2 25  19 - 32 mEq/L   Glucose, Bld 89  70 - 99 mg/dL   BUN 11  6 -  23 mg/dL   Creatinine, Ser 0.88  0.50 - 1.10 mg/dL   Calcium 9.1  8.4 - 10.5 mg/dL   Total Protein 6.9  6.0 - 8.3 g/dL   Albumin 3.5  3.5 - 5.2 g/dL   AST 37  0 - 37 U/L   ALT 24  0 - 35 U/L   Alkaline Phosphatase 47  39 - 117 U/L   Total Bilirubin 0.2 (*) 0.3 - 1.2 mg/dL   GFR calc non Af Amer 87 (*) >90 mL/min   GFR calc Af Amer >90  >90 mL/min   Comment: (NOTE)     The eGFR has been calculated using the CKD EPI equation.     This calculation has not been validated in all clinical situations.     eGFR's persistently <90 mL/min signify possible Chronic Kidney     Disease.  LIPASE, BLOOD     Status: None   Collection Time    08/28/13  5:20 PM      Result Value Ref Range   Lipase 30  11 - 59 U/L  LACTIC ACID, PLASMA     Status: None   Collection Time    08/28/13  6:12 PM      Result Value Ref Range   Lactic Acid, Venous 0.6  0.5 - 2.2 mmol/L  URINALYSIS, ROUTINE W REFLEX MICROSCOPIC     Status: Abnormal   Collection Time    08/28/13  6:50 PM      Result Value Ref Range   Color, Urine YELLOW  YELLOW   APPearance HAZY (*) CLEAR   Specific Gravity, Urine 1.016  1.005 - 1.030   pH 6.5  5.0 - 8.0   Glucose, UA NEGATIVE  NEGATIVE mg/dL   Hgb urine dipstick NEGATIVE  NEGATIVE   Bilirubin Urine NEGATIVE  NEGATIVE    Ketones, ur NEGATIVE  NEGATIVE mg/dL   Protein, ur NEGATIVE  NEGATIVE mg/dL   Urobilinogen, UA 1.0  0.0 - 1.0 mg/dL   Nitrite NEGATIVE  NEGATIVE   Leukocytes, UA SMALL (*) NEGATIVE  URINE MICROSCOPIC-ADD ON     Status: Abnormal   Collection Time    08/28/13  6:50 PM      Result Value Ref Range   Squamous Epithelial / LPF MANY (*) RARE   WBC, UA 3-6  <3 WBC/hpf   RBC / HPF 0-2  <3 RBC/hpf   Bacteria, UA FEW (*) RARE  CBC     Status: Abnormal   Collection Time    08/29/13  5:05 AM      Result Value Ref Range   WBC 6.6  4.0 - 10.5 K/uL   RBC 3.40 (*) 3.87 - 5.11 MIL/uL   Hemoglobin 9.6 (*) 12.0 - 15.0 g/dL   HCT 30.0 (*) 36.0 - 46.0 %   MCV 88.2  78.0 - 100.0 fL   MCH 28.2  26.0 - 34.0 pg   MCHC 32.0  30.0 - 36.0 g/dL   RDW 12.1  11.5 - 15.5 %   Platelets 262  150 - 400 K/uL    Imaging / Studies: Dg Abd Acute W/chest  08/28/2013   CLINICAL DATA:  Abdominal pain with fever, nausea and vomiting status post appendectomy.  EXAM: ACUTE ABDOMEN SERIES (ABDOMEN 2 VIEW & CHEST 1 VIEW)  COMPARISON:  CT 08/25/2013.  Portable chest 06/01/2007.  FINDINGS: The heart size and mediastinal contours are stable. The lungs are clear. There is no pleural effusion or pneumothorax.  The bowel gas pattern is  nonobstructive. There is fluid within the stomach and residual contrast material within the right colon. A small amount of free intraperitoneal air is attributed to recent abdominal surgery. Nonobstructing calculus in the lower pole of the right kidney and intrauterine device are noted.  IMPRESSION: Minimal pneumoperitoneum attributed to recent abdominal surgery. No evidence of bowel obstruction or other acute process.   Electronically Signed   By: Camie Patience M.D.   On: 08/28/2013 18:51    Scheduled Meds: . enoxaparin (LOVENOX) injection  40 mg Subcutaneous QHS  . ketorolac  15 mg Intravenous Once  . polyethylene glycol  17 g Oral Daily   Continuous Infusions: . 0.9 % NaCl with KCl 20 mEq / L  125 mL/hr at 08/29/13 0010   PRN Meds:.acetaminophen, HYDROmorphone (DILAUDID) injection, ondansetron, oxyCODONE   Antibiotics: Anti-infectives   None      Assessment/Plan POD#4 laparoscopic appendectomy Post operative ileus -allow for clears -IV hydration -miralax -mobilize -SCDs/lovenox Chronic migraines -give toradol x1 dose in addition to tylenol, minimize narcotics which may exacerbate her symptoms.  If no improvement, will give Imitrex SQ and phenergan  Erby Pian, [facility] Surgery Pager [phone] Office [phone]  08/29/2013 7:45 AM

## 2013-08-29 NOTE — Progress Notes (Signed)
Patient left with husband around [phone] when he arrived.

## 2013-08-29 NOTE — Progress Notes (Signed)
Patient has complained of headache through the night.  Has received Dilaudid for pain relief.  This morning vomited good content.  Had a small BM earlier in the evening after suppository.. No complaints of abdominal pain at this time.

## 2013-08-29 NOTE — Progress Notes (Signed)
Rumbling but no further BM. She requests another dose of Miralax - she takes this at home for her IBS. Encouraged ambulation. Patient examined and I agree with the assessment and plan  Violeta GelinasBurke Ryley Teater, MD, MPH, FACS Trauma: [phone] General Surgery: [phone]  08/29/2013 3:20 PM

## 2013-08-29 NOTE — Progress Notes (Addendum)
Home discharge instruction given, no questions verbalized.

## 2013-08-29 NOTE — Progress Notes (Signed)
UR completed 

## 2013-08-30 NOTE — Discharge Summary (Signed)
Physician Discharge Summary  Alexandria Vincent RUE:[id number] DOB: 02-25-1981 DOA: 08/28/2013  PCP: Cassell Smiles., MD  Consultation: none  Admit date: 08/28/2013 Discharge date: 08/29/2013  Recommendations for Outpatient Follow-up:  1. [facility] 09/11/13 at 3:45PM   Discharge Diagnoses:  1. Post operative ileus   Surgical Procedure: none  Discharge Condition: stable Disposition: home  Diet recommendation: regular  Filed Weights   08/28/13 2059  Weight: 155 lb 12.8 oz (70.67 kg)    Filed Vitals:   08/29/13 1841  BP: 126/63  Pulse: 66  Temp: 97.8 F (36.6 C)  Resp: 16    Hospital Course:  Alexandria Vincent was readmitted 4 days after a laparoscopic appendectomy was a post operative ileus and a headache.  She was admitted for IV hydration and pain control.  She was given a suppository and quickly improved.  For the treatment of migraine she was given toradol and quickly improved.  She was ambulating, tolerating a diet and felt stable for discharge home.  She will follow up in the [facility] clinic on the 14th. She will call with questions or concerns as previously mentioned.    Discharge Instructions     Medication List         flintstones complete 60 MG chewable tablet  Chew 2 tablets by mouth daily.     levonorgestrel 20 MCG/24HR IUD  Commonly known as:  MIRENA  1 each by Intrauterine route once.     ondansetron 4 MG tablet  Commonly known as:  ZOFRAN  Take 1 tablet (4 mg total) by mouth every 6 (six) hours as needed for nausea or vomiting.     oxyCODONE-acetaminophen 5-325 MG per tablet  Commonly known as:  PERCOCET/ROXICET  Take 1-2 tablets by mouth every 4 (four) hours as needed for moderate pain.     SUMAtriptan 100 MG tablet  Commonly known as:  IMITREX  Take 1 tablet (100 mg total) by mouth once as needed for migraine or headache. May repeat in 2 hours if headache persists or recurs.          The results of significant diagnostics from this  hospitalization (including imaging, microbiology, ancillary and laboratory) are listed below for reference.    Significant Diagnostic Studies: Ct Abdomen Pelvis W Contrast  08/25/2013   CLINICAL DATA:  Upper and lower abdominal pain with tenderness. Nausea and vomiting.  EXAM: CT ABDOMEN AND PELVIS WITH CONTRAST  TECHNIQUE: Multidetector CT imaging of the abdomen and pelvis was performed using the standard protocol following bolus administration of intravenous contrast.  CONTRAST:  OMNIPAQUE IOHEXOL 300 MG/ML  SOLN  COMPARISON:  None.  FINDINGS: BODY WALL: Unremarkable.  LOWER CHEST: Unremarkable.  ABDOMEN/PELVIS:  Liver: There is volume loss and mild hypo enhancement involving the lower, anterior right liver, consistent with scarring. There is associated concavity of the right anterior chest wall which could be developmental or posttraumatic. There is a tiny hypervascular focus in the upper midline liver, subcapsular, which is incidental based on age.  Biliary: Distended gallbladder without evidence of wall thickening or calcified stone.  Pancreas: Unremarkable.  Spleen: Unremarkable.  Adrenals: Unremarkable.  Kidneys and ureters: 3 mm nonobstructive stone in the lower pole right kidney  Bladder: Unremarkable.  Reproductive: 3.3 cm cyst in the left adnexa, likely intra-ovarian and follicular. There is an IUD which is well positioned.  Bowel: The appendix is dilated and thick walled. Outer wall diameter measures 14 mm. There is internal debris and appendicoliths. There is mild periappendiceal stranding. Negative for  perforation. No bowel obstruction.  Retroperitoneum: No mass or adenopathy.  Peritoneum: Trace pelvic free fluid  Vascular: No acute abnormality.  OSSEOUS: No acute abnormalities.  Critical Value/emergent results were called by telephone at the time of interpretation on 08/25/2013 at 2:38 AM to Dr. Benjiman CoreNATHAN PICKERING , who verbally acknowledged these results.  IMPRESSION: 1. Acute, non-perforated  appendicitis. 2. Nonobstructive right nephrolithiasis. 3. Additional incidental findings are noted above.   Electronically Signed   By: Tiburcio PeaJonathan  Watts M.D.   On: 08/25/2013 02:41   Dg Abd Acute W/chest  08/28/2013   CLINICAL DATA:  Abdominal pain with fever, nausea and vomiting status post appendectomy.  EXAM: ACUTE ABDOMEN SERIES (ABDOMEN 2 VIEW & CHEST 1 VIEW)  COMPARISON:  CT 08/25/2013.  Portable chest 06/01/2007.  FINDINGS: The heart size and mediastinal contours are stable. The lungs are clear. There is no pleural effusion or pneumothorax.  The bowel gas pattern is nonobstructive. There is fluid within the stomach and residual contrast material within the right colon. A small amount of free intraperitoneal air is attributed to recent abdominal surgery. Nonobstructing calculus in the lower pole of the right kidney and intrauterine device are noted.  IMPRESSION: Minimal pneumoperitoneum attributed to recent abdominal surgery. No evidence of bowel obstruction or other acute process.   Electronically Signed   By: Roxy HorsemanBill  Veazey M.D.   On: 08/28/2013 18:51    Microbiology: Recent Results (from the past 240 hour(s))  SURGICAL PCR SCREEN     Status: None   Collection Time    08/25/13  6:03 AM      Result Value Ref Range Status   MRSA, PCR NEGATIVE  NEGATIVE Final   Staphylococcus aureus NEGATIVE  NEGATIVE Final   Comment:            The Xpert SA Assay (FDA     approved for NASAL specimens     in patients over 31 years of age),     is one component of     a comprehensive surveillance     program.  Test performance has     been validated by [facility] for patients greater     than or equal to 31 year old.     It is not intended     to diagnose infection nor to     guide or monitor treatment.     Labs: Basic Metabolic Panel:  Recent Labs Lab 08/24/13 2256 08/28/13 1720  NA 142 143  K 4.5 4.0  CL 102 105  CO2 21 25  GLUCOSE 99 89  BUN 21 11  CREATININE 0.81 0.88  CALCIUM 9.8  9.1   Liver Function Tests:  Recent Labs Lab 08/24/13 2256 08/28/13 1720  AST 24 37  ALT 14 24  ALKPHOS 57 47  BILITOT 0.5 0.2*  PROT 7.7 6.9  ALBUMIN 4.3 3.5    Recent Labs Lab 08/24/13 2256 08/28/13 1720  LIPASE 26 30   No results found for this basename: AMMONIA,  in the last 168 hours CBC:  Recent Labs Lab 08/24/13 2256 08/26/13 0625 08/28/13 1720 08/29/13 0505  WBC 8.9 6.1 6.1 6.6  NEUTROABS 6.1  --  4.0  --   HGB 12.2 9.9* 11.0* 9.6*  HCT 37.1 30.6* 34.7* 30.0*  MCV 86.9 90.3 89.4 88.2  PLT 266 208 292 262   Cardiac Enzymes: No results found for this basename: CKTOTAL, CKMB, CKMBINDEX, TROPONINI,  in the last 168 hours BNP: BNP (last 3  results) No results found for this basename: PROBNP,  in the last 8760 hours CBG: No results found for this basename: GLUCAP,  in the last 168 hours  Active Problems:   Postoperative ileus   Time coordinating discharge: <30 mins  Signed:  Dhillon [place]un[place], ANP-BC

## 2013-08-30 NOTE — Discharge Summary (Signed)
Lurleen Soltero, MD, MPH, FACS Trauma: [phone] General Surgery: [phone]  

## 2013-09-11 ENCOUNTER — Encounter (INDEPENDENT_AMBULATORY_CARE_PROVIDER_SITE_OTHER): Payer: Self-pay | Admitting: General Surgery

## 2013-09-11 ENCOUNTER — Ambulatory Visit (INDEPENDENT_AMBULATORY_CARE_PROVIDER_SITE_OTHER): Payer: BC Managed Care – PPO | Admitting: General Surgery

## 2013-09-11 VITALS — BP 122/78 | HR 72 | Temp 97.8°F | Resp 14 | Ht 71.0 in | Wt 147.8 lb

## 2013-09-11 DIAGNOSIS — K3580 Unspecified acute appendicitis: Secondary | ICD-10-CM

## 2013-09-11 NOTE — Patient Instructions (Signed)
Call us if we can help or you have a problem.

## 2013-09-11 NOTE — Progress Notes (Signed)
Alexandria ClickCAROLYN Vincent [birth year] [id number] 09/11/2013   History of Present Illness: Alexandria Vincent is a  33 y.o. female who presents today status post lap appy by Dr. Gaynelle AduEric Wilson.  Pathology reveals  Appendix, Other than Incidental - ACUTE SUPPURATIVE APPENDICITIS. .  The patient is tolerating a regular diet, having normal bowel movements, has good pain control.  She  is back to most normal activities.   Physical Exam:  BP 122/78  Pulse 72  Temp(Src) 97.8 F (36.6 C) (Oral)  Resp 14  Ht 5\' 11"  (1.803 m)  Wt 67.042 kg (147 lb 12.8 oz)  BMI 20.62 kg/m2  LMP 08/14/2013  Abd: soft, nontender, active bowel sounds, nondistended.  All incisions are well healed.  Impression: 1.  Acute appendicitis, s/p lap appy  Plan: She  is able to return to normal activities. She  may follow up on a prn basis.

## 2013-11-21 ENCOUNTER — Telehealth: Payer: Self-pay | Admitting: Adult Health

## 2013-11-21 MED ORDER — LORAZEPAM 0.5 MG PO TABS: 0.5000 mg | ORAL_TABLET | Freq: Three times a day (TID) | ORAL | Status: DC

## 2013-11-21 NOTE — Telephone Encounter (Signed)
Feeling stressed, teary wants refill on ativan and has appt 9/29 to discuss.

## 2013-11-26 ENCOUNTER — Ambulatory Visit (INDEPENDENT_AMBULATORY_CARE_PROVIDER_SITE_OTHER): Payer: BC Managed Care – PPO | Admitting: Adult Health

## 2013-11-26 ENCOUNTER — Encounter: Payer: Self-pay | Admitting: Adult Health

## 2013-11-26 VITALS — BP 104/70 | Ht 71.0 in | Wt 154.5 lb

## 2013-11-26 DIAGNOSIS — F419 Anxiety disorder, unspecified: Secondary | ICD-10-CM | POA: Insufficient documentation

## 2013-11-26 DIAGNOSIS — F411 Generalized anxiety disorder: Secondary | ICD-10-CM

## 2013-11-26 HISTORY — DX: Anxiety disorder, unspecified: F41.9

## 2013-11-26 MED ORDER — SERTRALINE HCL 25 MG PO TABS
25.0000 mg | ORAL_TABLET | Freq: Every day | ORAL | Status: DC
Start: 2013-11-26 — End: 2014-01-28

## 2013-11-26 NOTE — Progress Notes (Signed)
Subjective:     Patient ID: Shanice Poznanski, female   DOB: [birth year], 33 y.o.   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   Review of Systems See HPI Reviewed past medical,surgical, social and family history. Reviewed medications and allergies.     Objective:   Physical Exam BP 104/70  Ht  (1.803 m)  Wt 154 lb 8 oz (70.081 kg)  BMI 21.56 kg/m2  LMP 11/05/2013  Breastfeeding? No   Discussed taking time for self, and letting things go,may need to see counselor for her if not better with meds, will start low.Talk with partner and try to establish intimacy again.  Assessment:     Anxiety     Plan:     Rx zoloft 25 mg #30 1 daily with 1 refill Review handout on anxiety Follow up in 3 weeks

## 2013-11-26 NOTE — Patient Instructions (Signed)
Generalized Anxiety Disorder Generalized anxiety disorder (GAD) is a mental disorder. It interferes with life functions, including relationships, work, and school. GAD is different from normal anxiety, which everyone experiences at some point in their lives in response to specific life events and activities. Normal anxiety actually helps [place] prepare for and get through these life events and activities. Normal anxiety goes away after the event or activity is over.  GAD causes anxiety that is not necessarily related to specific events or activities. It also causes excess anxiety in proportion to specific events or activities. The anxiety associated with GAD is also difficult to control. GAD can vary from mild to severe. People with severe GAD can have intense waves of anxiety with physical symptoms (panic attacks).  SYMPTOMS The anxiety and worry associated with GAD are difficult to control. This anxiety and worry are related to many life events and activities and also occur more days than not for 6 months or longer. People with GAD also have three or more of the following symptoms (one or more in children):  Restlessness.   Fatigue.  Difficulty concentrating.   Irritability.  Muscle tension.  Difficulty sleeping or unsatisfying sleep. DIAGNOSIS GAD is diagnosed through an assessment by your health care provider. Your health care provider will ask you questions aboutyour mood,physical symptoms, and events in your life. Your health care provider may ask you about your medical history and use of alcohol or drugs, including prescription medicines. Your health care provider may also do a physical exam and blood tests. Certain medical conditions and the use of certain substances can cause symptoms similar to those associated with GAD. Your health care provider may refer you to a mental health specialist for further evaluation. TREATMENT The following therapies are usually used to treat GAD:    Medication. Antidepressant medication usually is prescribed for long-term daily control. Antianxiety medicines may be added in severe cases, especially when panic attacks occur.   Talk therapy (psychotherapy). Certain types of talk therapy can be helpful in treating GAD by providing support, education, and guidance. A form of talk therapy called cognitive behavioral therapy can teach you healthy ways to think about and react to daily life events and activities.  Stress managementtechniques. These include yoga, meditation, and exercise and can be very helpful when they are practiced regularly. A mental health specialist can help determine which treatment is best for you. Some people see improvement with one therapy. However, other people require a combination of therapies. Document Released: 06/12/2012 Document Revised: 07/02/2013 Document Reviewed: 06/12/2012 [facility] Patient Information 2015 [place], [place]. This information is not intended to replace advice given to you by your health care provider. Make sure you discuss any questions you have with your health care provider. Take zoloft 1 daily  Return in 3 weeks

## 2013-12-17 ENCOUNTER — Ambulatory Visit: Payer: BC Managed Care – PPO | Admitting: Adult Health

## 2013-12-31 ENCOUNTER — Encounter: Payer: Self-pay | Admitting: Adult Health

## 2014-01-21 ENCOUNTER — Other Ambulatory Visit: Payer: BC Managed Care – PPO | Admitting: Adult Health

## 2014-01-28 ENCOUNTER — Encounter: Payer: Self-pay | Admitting: Adult Health

## 2014-01-28 ENCOUNTER — Other Ambulatory Visit (HOSPITAL_COMMUNITY)
Admission: RE | Admit: 2014-01-28 | Discharge: 2014-01-28 | Disposition: A | Discharge status: Home / Self Care | Payer: BC Managed Care – PPO | Source: Ambulatory Visit | Attending: Adult Health | Admitting: Adult Health

## 2014-01-28 ENCOUNTER — Ambulatory Visit (INDEPENDENT_AMBULATORY_CARE_PROVIDER_SITE_OTHER): Payer: BC Managed Care – PPO | Admitting: Adult Health

## 2014-01-28 VITALS — BP 108/72 | HR 70 | Ht 71.0 in | Wt 152.0 lb

## 2014-01-28 DIAGNOSIS — N760 Acute vaginitis: Secondary | ICD-10-CM

## 2014-01-28 DIAGNOSIS — Z1151 Encounter for screening for human papillomavirus (HPV): Secondary | ICD-10-CM | POA: Insufficient documentation

## 2014-01-28 DIAGNOSIS — Z1212 Encounter for screening for malignant neoplasm of rectum: Secondary | ICD-10-CM

## 2014-01-28 DIAGNOSIS — Z01419 Encounter for gynecological examination (general) (routine) without abnormal findings: Secondary | ICD-10-CM | POA: Insufficient documentation

## 2014-01-28 DIAGNOSIS — B9689 Other specified bacterial agents as the cause of diseases classified elsewhere: Secondary | ICD-10-CM | POA: Insufficient documentation

## 2014-01-28 DIAGNOSIS — N898 Other specified noninflammatory disorders of vagina: Secondary | ICD-10-CM

## 2014-01-28 DIAGNOSIS — Z975 Presence of (intrauterine) contraceptive device: Secondary | ICD-10-CM

## 2014-01-28 HISTORY — DX: Other specified bacterial agents as the cause of diseases classified elsewhere: B96.89

## 2014-01-28 HISTORY — DX: Other specified noninflammatory disorders of vagina: N89.8

## 2014-01-28 LAB — POCT WET PREP (WET MOUNT): WBC WET PREP: POSITIVE

## 2014-01-28 MED ORDER — METRONIDAZOLE 0.75 % VA GEL: 1.0000 | Freq: Two times a day (BID) | VAGINAL | Status: DC

## 2014-01-28 NOTE — Patient Instructions (Signed)
Bacterial Vaginosis Bacterial vaginosis is a vaginal infection that occurs when the normal balance of bacteria in the vagina is disrupted. It results from an overgrowth of certain bacteria. This is the most common vaginal infection in women of childbearing age. Treatment is important to prevent complications, especially in pregnant women, as it can cause a premature delivery. CAUSES  Bacterial vaginosis is caused by an increase in harmful bacteria that are normally present in smaller amounts in the vagina. Several different kinds of bacteria can cause bacterial vaginosis. However, the reason that the condition develops is not fully understood. RISK FACTORS Certain activities or behaviors can put you at an increased risk of developing bacterial vaginosis, including:  Having a new sex partner or multiple sex partners.  Douching.  Using an intrauterine device (IUD) for contraception. Women do not get bacterial vaginosis from toilet seats, bedding, swimming pools, or contact with objects around them. SIGNS AND SYMPTOMS  Some women with bacterial vaginosis have no signs or symptoms. Common symptoms include:  Grey vaginal discharge.  A fishlike odor with discharge, especially after sexual intercourse.  Itching or burning of the vagina and vulva.  Burning or pain with urination. DIAGNOSIS  Your health care provider will take a medical history and examine the vagina for signs of bacterial vaginosis. A sample of vaginal fluid may be taken. Your health care provider will look at this sample under a microscope to check for bacteria and abnormal cells. A vaginal pH test may also be done.  TREATMENT  Bacterial vaginosis may be treated with antibiotic medicines. These may be given in the form of a pill or a vaginal cream. A second round of antibiotics may be prescribed if the condition comes back after treatment.  HOME CARE INSTRUCTIONS   Only take over-the-counter or prescription medicines as  directed by your health care provider.  If antibiotic medicine was prescribed, take it as directed. Make sure you finish it even if you start to feel better.  Do not have sex until treatment is completed.  Tell all sexual partners that you have a vaginal infection. They should see their health care provider and be treated if they have problems, such as a mild rash or itching.  Practice safe sex by using condoms and only having one sex partner. SEEK MEDICAL CARE IF:   Your symptoms are not improving after 3 days of treatment.  You have increased discharge or pain.  You have a fever. MAKE SURE YOU:   Understand these instructions.  Will watch your condition.  Will get help right away if you are not doing well or get worse. FOR MORE INFORMATION  Centers for Disease Control and Prevention, Division of STD Prevention: SolutionApps.co.zawww.cdc.gov/std American Sexual Health Association (ASHA): www.ashastd.org  Document Released: 02/15/2005 Document Revised: 12/06/2012 Document Reviewed: 09/27/2012 [facility] Patient Information 2015 [place], [place]. This information is not intended to replace advice given to you by your health care provider. Make sure you discuss any questions you have with your health care provider. physical in 1 year Mammogram at 4440

## 2014-01-28 NOTE — Progress Notes (Signed)
Patient ID: Alexandria ClickCarolyn Vincent, female   DOB: 11-18-1980, 33 y.o.   MRN: [id number] History of Present Illness: Alexandria Vincent is a [age] year ol white female, married in for pap and physical and is complaining of vaginal discharge with odor, has IUD.   Current Medications, Allergies, Past Medical History, Past Surgical History, Family History and Social History were reviewed in [facility] Link electronic medical record.     Review of Systems: Patient denies any headaches, blurred vision, shortness of breath, chest pain, abdominal pain, problems with bowel movements, urination, or intercourse. No joint pain and moods better.    Physical Exam:BP 108/72 mmHg  Pulse 70  Ht 5\' 11"  (1.803 m)  Wt 152 lb (68.947 kg)  BMI 21.21 kg/m2  LMP 01/14/2014  Breastfeeding? No General:  Well developed, well nourished, no acute distress Skin:  Warm and dry Neck:  Midline trachea, normal thyroid Lungs; Clear to auscultation bilaterally Breast:  No dominant palpable mass, retraction, or nipple discharge Cardiovascular: Regular rate and rhythm Abdomen:  Soft, non tender, no hepatosplenomegaly Pelvic:  External genitalia is normal in appearance.  The vagina has creamy discharge with odor.              The cervix is bulbous, everted at os, with +IUD strings, and is friable with EC brush, pap performed with HPV, wet prep +WBCs and clue cells.  Uterus is felt to be normal size, shape, and contour.  No      adnexal masses or tenderness noted. Extremities:  No swelling or varicosities noted Psych:  No mood changes,alert and cooperative,seems happy   Impression: Well woman gyn exam with pap Vaginal discharge BV IUD in place    Plan: Rx Metrogel use 1 applicator bid x 5 nights, can do just once daily if desired with 1 refill Review handout on BV Physical in 1 year Mammogram at 42

## 2014-01-30 LAB — CYTOLOGY - PAP

## 2014-02-04 ENCOUNTER — Telehealth: Payer: Self-pay | Admitting: Adult Health

## 2014-02-04 NOTE — Telephone Encounter (Signed)
Spoke with pt letting her know pap was normal. Alexandria Vincent 

## 2014-10-11 ENCOUNTER — Telehealth: Payer: Self-pay | Admitting: Adult Health

## 2014-10-14 ENCOUNTER — Telehealth: Payer: Self-pay | Admitting: Adult Health

## 2014-10-14 MED ORDER — LORAZEPAM 0.5 MG PO TABS: 0.5000 mg | ORAL_TABLET | ORAL | Status: DC | PRN

## 2014-10-14 NOTE — Telephone Encounter (Signed)
She is feeling overwhelmed, will refill ativan

## 2014-10-14 NOTE — Telephone Encounter (Signed)
Left message I called 

## 2014-12-19 ENCOUNTER — Encounter: Payer: Self-pay | Admitting: Adult Health

## 2014-12-19 ENCOUNTER — Ambulatory Visit (INDEPENDENT_AMBULATORY_CARE_PROVIDER_SITE_OTHER): Payer: BC Managed Care – PPO | Admitting: Adult Health

## 2014-12-19 VITALS — BP 110/62 | HR 64 | Ht 71.0 in | Wt 155.0 lb

## 2014-12-19 DIAGNOSIS — N72 Inflammatory disease of cervix uteri: Secondary | ICD-10-CM | POA: Diagnosis not present

## 2014-12-19 DIAGNOSIS — Z975 Presence of (intrauterine) contraceptive device: Secondary | ICD-10-CM | POA: Diagnosis not present

## 2014-12-19 DIAGNOSIS — N898 Other specified noninflammatory disorders of vagina: Secondary | ICD-10-CM

## 2014-12-19 DIAGNOSIS — N926 Irregular menstruation, unspecified: Secondary | ICD-10-CM | POA: Diagnosis not present

## 2014-12-19 DIAGNOSIS — R1032 Left lower quadrant pain: Secondary | ICD-10-CM

## 2014-12-19 HISTORY — DX: Irregular menstruation, unspecified: N92.6

## 2014-12-19 HISTORY — DX: Left lower quadrant pain: R10.32

## 2014-12-19 HISTORY — DX: Inflammatory disease of cervix uteri: N72

## 2014-12-19 LAB — POCT WET PREP (WET MOUNT): WBC, Wet Prep HPF POC: POSITIVE

## 2014-12-19 MED ORDER — DOXYCYCLINE HYCLATE 100 MG PO TABS: 100.0000 mg | ORAL_TABLET | Freq: Two times a day (BID) | ORAL | Status: DC

## 2014-12-19 NOTE — Patient Instructions (Signed)
Cervicitis Cervicitis is a soreness and swelling (inflammation) of the cervix. Your cervix is located at the bottom of your uterus. It opens up to the vagina. CAUSES   Sexually transmitted infections (STIs).   Allergic reaction.   Medicines or birth control devices that are put in the vagina.   Injury to the cervix.   Bacterial infections.  RISK FACTORS You are at greater risk if you:  Have unprotected sexual intercourse.  Have sexual intercourse with many partners.  Began sexual intercourse at an early age.  Have a history of STIs. SYMPTOMS  There may be no symptoms. If symptoms occur, they may include:   Gray, white, yellow, or bad-smelling vaginal discharge.   Pain or itching of the area outside the vagina.   Painful sexual intercourse.   Lower abdominal or lower back pain, especially during intercourse.   Frequent urination.   Abnormal vaginal bleeding between periods, after sexual intercourse, or after menopause.   Pressure or a heavy feeling in the pelvis.  DIAGNOSIS  Diagnosis is made after a pelvic exam. Other tests may include:   Examination of any discharge under a microscope (wet prep).   A Pap test.  TREATMENT  Treatment will depend on the cause of cervicitis. If it is caused by an STI, both you and your partner will need to be treated. Antibiotic medicines will be given.  HOME CARE INSTRUCTIONS   Do not have sexual intercourse until your health care provider says it is okay.   Do not have sexual intercourse until your partner has been treated, if your cervicitis is caused by an STI.   Take your antibiotics as directed. Finish them even if you start to feel better.  SEEK MEDICAL CARE IF:  Your symptoms come back.   You have a fever.  MAKE SURE YOU:   Understand these instructions.  Will watch your condition.  Will get help right away if you are not doing well or get worse.   This information is not intended to replace  advice given to you by your health care provider. Make sure you discuss any questions you have with your health care provider.   Document Released: 02/15/2005 Document Revised: 02/20/2013 Document Reviewed: 08/09/2012 Elsevier Interactive Patient Education [employer]2016 [employer]vier Inc. Return in 12 days for US  December 1 for pap and physical  Take doxy

## 2014-12-19 NOTE — Progress Notes (Signed)
Subjective:     Patient ID: Jolaine ClickCarolyn Hornak, female   DOB: [birth year], 34 y.o.   MRN: [id number]  HPI Eber JonesCarolyn is a 34 year old white female,married, has IUD in complaining of spotting almost daily with IUD and has pinching sensation in left side on and off.Has used metrogel and it helps some.  Review of Systems Patient denies any headaches, hearing loss, fatigue, blurred vision, shortness of breath, chest pain, abdominal pain, problems with bowel movements, urination, or intercourse. No joint pain or mood swings.See HPI for positives.  Reviewed past medical,surgical, social and family history. Reviewed medications and allergies.     Objective:   Physical Exam BP 110/62 mmHg  Pulse 64  Ht 5\' 11"  (1.803 m)  Wt 155 lb (70.308 kg)  BMI 21.63 kg/m2   Skin warm and dry.Pelvic: external genitalia is normal in appearance no lesions, vagina:  copious amount of  yellowish discharge without odor,removed with procto swap,urethra has no lesions or masses noted, cervix is irritated and red,slightly friable, bulbous,+IUD strings at os. uterus: normal size, shape and contour, non tender, no masses felt, adnexa: no masses,LLQ tenderness noted. Bladder is non tender and no masses felt. Wet prep: many +WBCs.  Assessment:     Irregular bleeding Vaginal discharge LLQ pain Cervicitis IUD in place    Plan:    Rx doxycycline 100 mg #20 1 bid x 10 days Return in 12 days for gyn US Pap and physical 01/30/15 Review handout on cervicitis

## 2014-12-31 ENCOUNTER — Ambulatory Visit (INDEPENDENT_AMBULATORY_CARE_PROVIDER_SITE_OTHER): Payer: BC Managed Care – PPO

## 2014-12-31 DIAGNOSIS — Z975 Presence of (intrauterine) contraceptive device: Secondary | ICD-10-CM | POA: Diagnosis not present

## 2014-12-31 DIAGNOSIS — R1032 Left lower quadrant pain: Secondary | ICD-10-CM | POA: Diagnosis not present

## 2014-12-31 NOTE — Progress Notes (Signed)
PELVIS TA/TV: homogenous anteverted uterus with IUD centrally located w/in the uterus,rt ov: 2.4 x 1.2 x 1.8 simple dominate follicle,lt ov contains two cysts (#1)4.9 x 4.7 x 4.8 cm hemorrhagic cyst,(#2) simple cyst 2.9 x 2.8 x 1.6cm,ov's appear to be mobile,no free fluid

## 2015-01-01 ENCOUNTER — Telehealth: Payer: Self-pay | Admitting: Adult Health

## 2015-01-01 NOTE — Telephone Encounter (Signed)
Pt aware of U/S

## 2015-01-07 ENCOUNTER — Telehealth: Payer: Self-pay | Admitting: Adult Health

## 2015-01-07 MED ORDER — ESCITALOPRAM OXALATE 10 MG PO TABS: 10.0000 mg | ORAL_TABLET | Freq: Every day | ORAL | Status: DC

## 2015-01-07 MED ORDER — LORAZEPAM 0.5 MG PO TABS: 0.5000 mg | ORAL_TABLET | ORAL | Status: DC | PRN

## 2015-01-07 NOTE — Telephone Encounter (Signed)
Bleeding for 1 week,US showed cyst,but IUD in position.This could be period, if still bleeding in a few days call for appt,could try megace

## 2015-01-07 NOTE — Telephone Encounter (Signed)
Pt says she needs to get back on meds for anxiety, will rx lexapro and refill ativan

## 2015-01-30 ENCOUNTER — Ambulatory Visit (INDEPENDENT_AMBULATORY_CARE_PROVIDER_SITE_OTHER): Payer: BC Managed Care – PPO | Admitting: Adult Health

## 2015-01-30 ENCOUNTER — Encounter: Payer: Self-pay | Admitting: Adult Health

## 2015-01-30 VITALS — BP 120/72 | HR 52 | Ht 68.0 in | Wt 155.0 lb

## 2015-01-30 DIAGNOSIS — Z975 Presence of (intrauterine) contraceptive device: Secondary | ICD-10-CM

## 2015-01-30 DIAGNOSIS — F419 Anxiety disorder, unspecified: Secondary | ICD-10-CM

## 2015-01-30 DIAGNOSIS — Z01419 Encounter for gynecological examination (general) (routine) without abnormal findings: Secondary | ICD-10-CM | POA: Diagnosis not present

## 2015-01-30 NOTE — Patient Instructions (Signed)
Physical in 1 year Follow up in 6 months or before if needed

## 2015-01-30 NOTE — Progress Notes (Signed)
Patient ID: Alexandria Vincent, female   DOB: 12-Jan-1981, 34 y.o.   MRN: [id number] History of Present Illness: Alexandria Vincent is a 34 year old white female, married in for a well woman gyn exam, she had a normal pap with negative HPV 01/28/14.Bleeding has stopped since taking doxycycline.She says she is feeling better since starting the lexapro.    Current Medications, Allergies, Past Medical History, Past Surgical History, Family History and Social History were reviewed in [facility] electronic medical record.     Review of Systems: Patient denies any headaches, hearing loss, fatigue, blurred vision, shortness of breath, chest pain, abdominal pain, problems with bowel movements, urination, or intercourse. No joint pain or mood swings.    Physical Exam:BP 120/72 mmHg  Pulse 52  Ht 5\' 8"  (1.727 m)  Wt 155 lb (70.308 kg)  BMI 23.57 kg/m2 General:  Well developed, well nourished, no acute distress Skin:  Warm and dry Neck:  Midline trachea, normal thyroid, good ROM, no lymphadenopathy Lungs; Clear to auscultation bilaterally Breast:  No dominant palpable mass, retraction, or nipple discharge Cardiovascular: Regular rate and rhythm Abdomen:  Soft, non tender, no hepatosplenomegaly Pelvic:  External genitalia is normal in appearance, no lesions.  The vagina is normal in appearance. Urethra has no lesions or masses. The cervix is bulbous,everted at os but not friable today, IUD strings at os.  Uterus is felt to be normal size, shape, and contour.  No adnexal masses or tenderness noted.Bladder is non tender, no masses felt. Extremities/musculoskeletal:  No swelling or varicosities noted, no clubbing or cyanosis Psych:  No mood changes, alert and cooperative,seems happy   Impression: Well woman gyn exam no pap IUD in place Anxiety     Plan: Continue lexapro Follow up in 6 months or before if needed Physical in 1 year

## 2015-02-08 IMAGING — CR DG ABDOMEN ACUTE W/ 1V CHEST
3 series · 3 of 3 positions shown · non-contrast
Comparison: CT 08/25/2013.  Portable chest 06/01/2007.

CLINICAL DATA: Abdominal pain with fever, nausea and vomiting
status post appendectomy.

EXAM:
ACUTE ABDOMEN SERIES (ABDOMEN 2 VIEW & CHEST 1 VIEW)

[w chest pa]
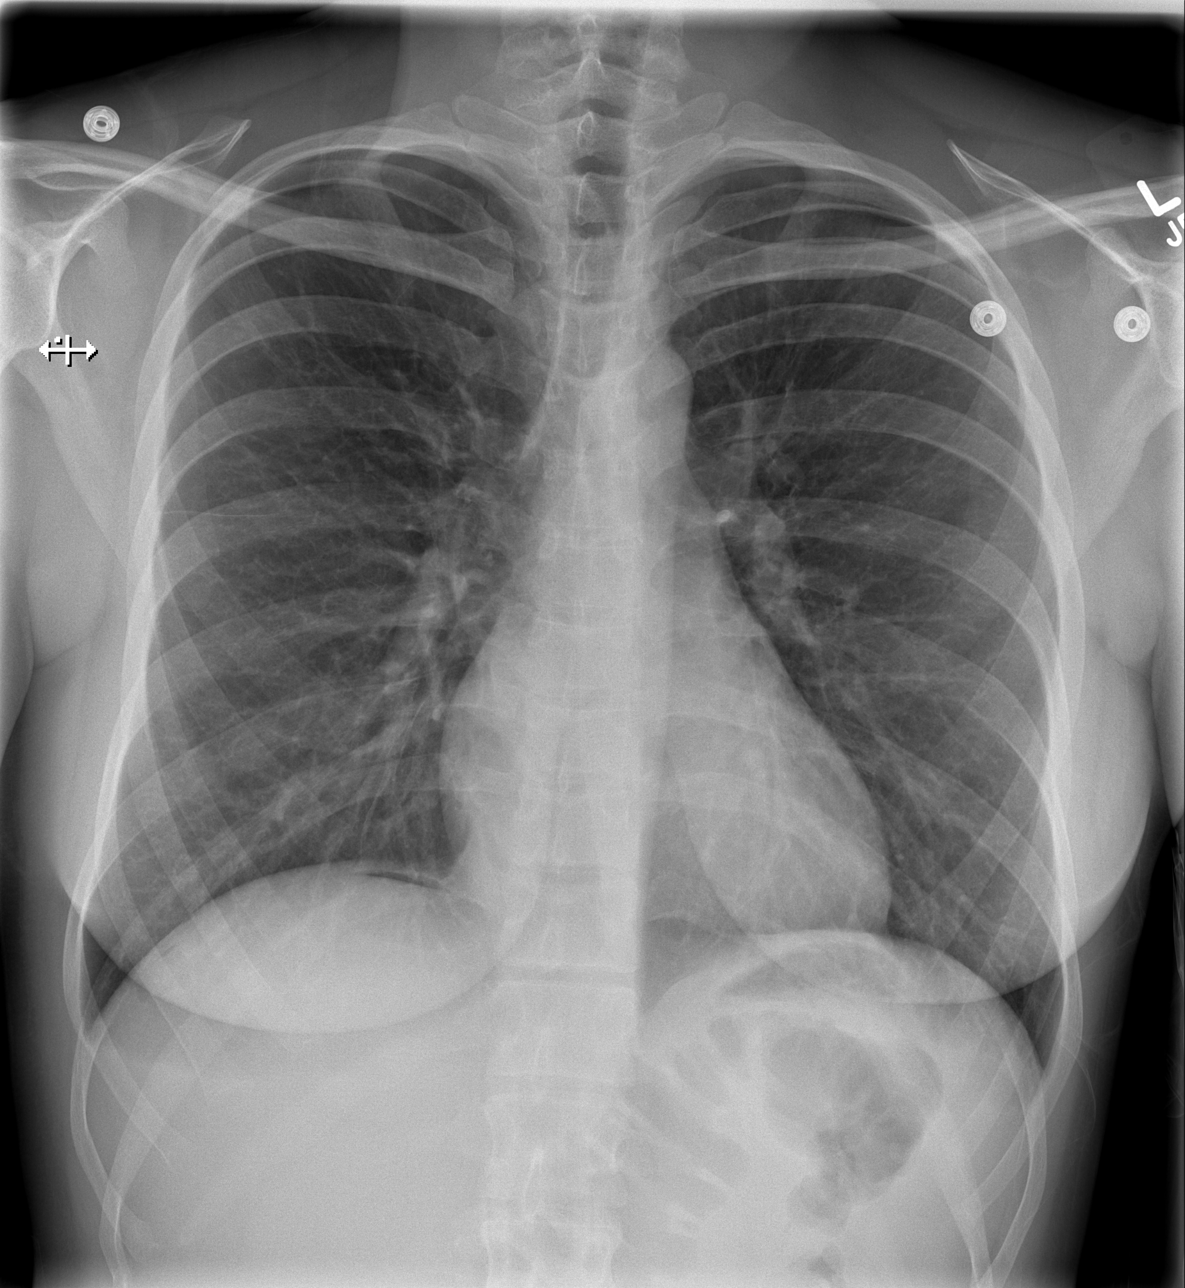

[w abdomen upright]
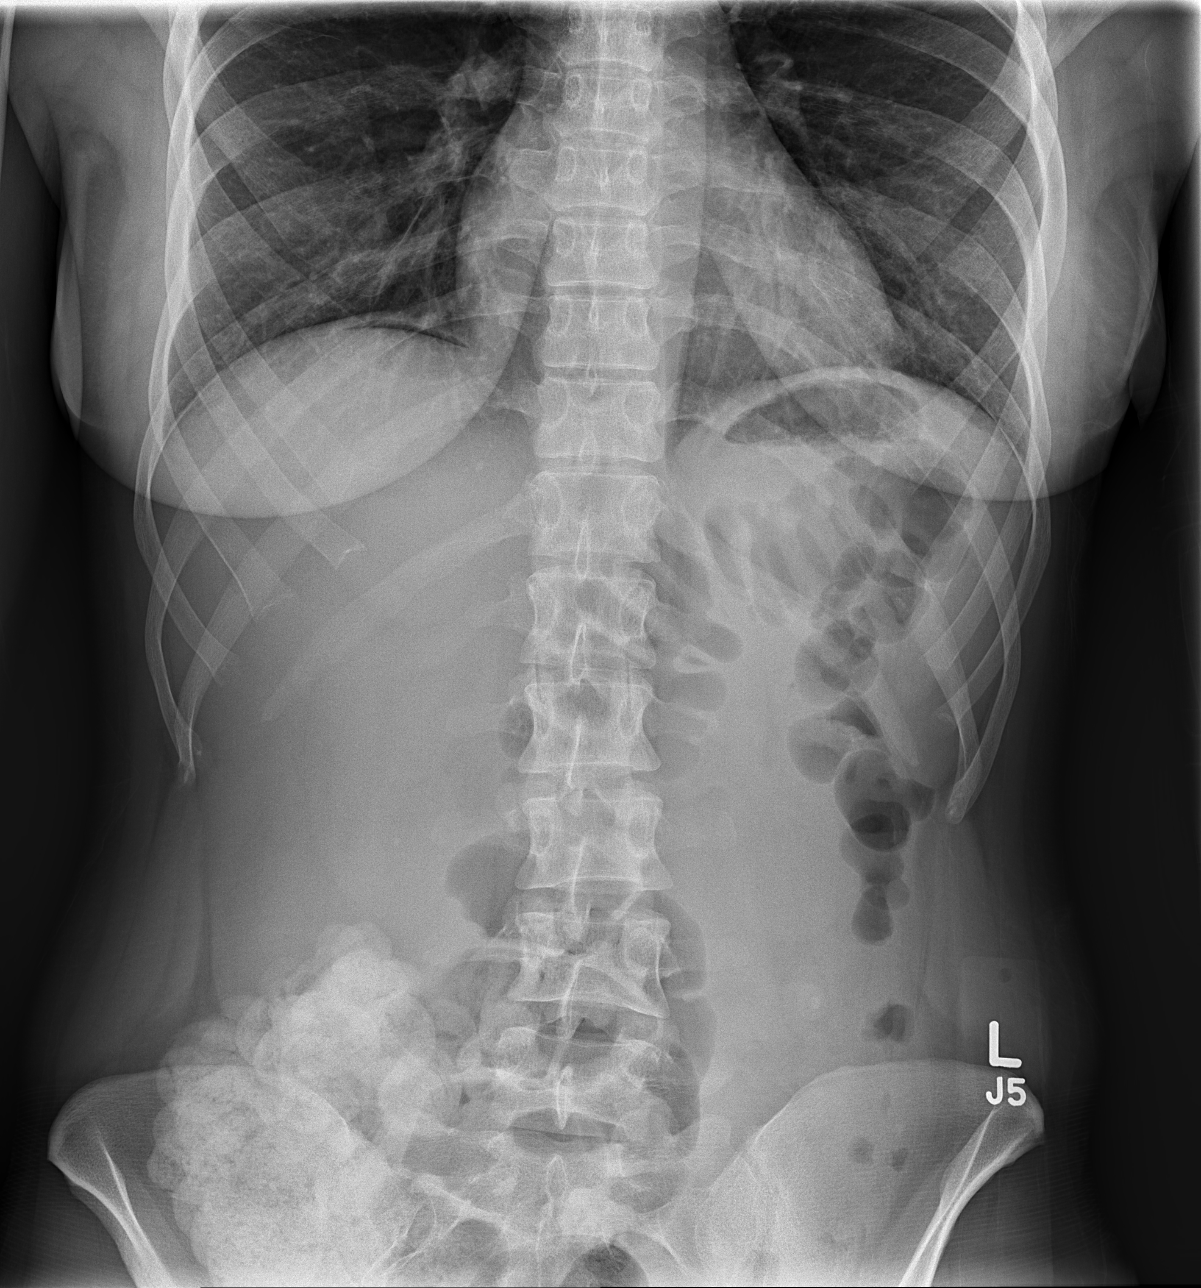

[t abdomen supine]
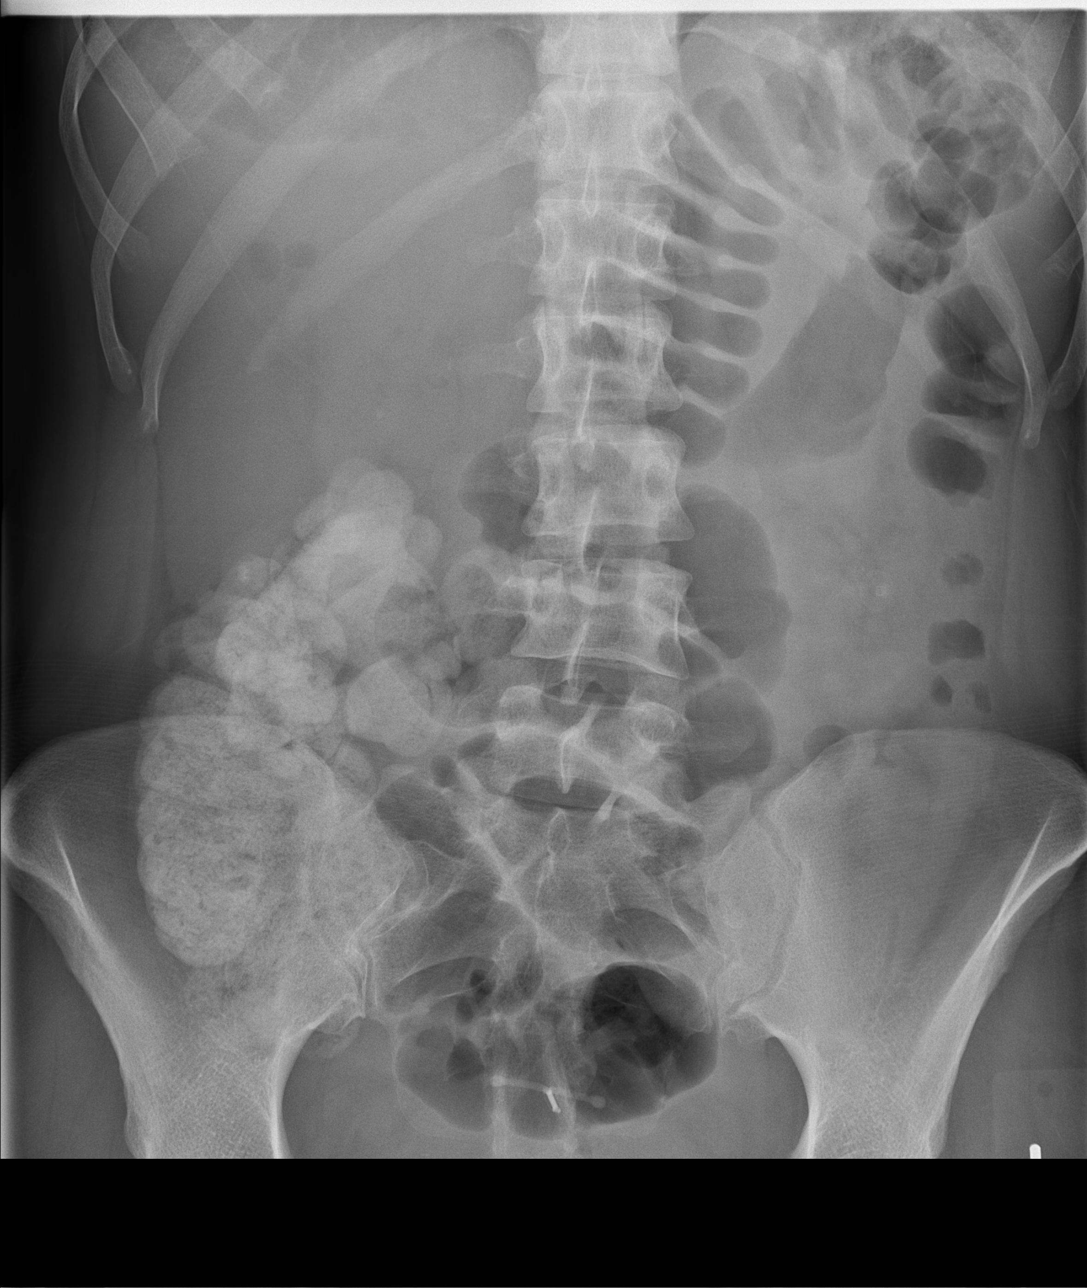

[3 of 3 positions shown; findings below may reference images not displayed]

FINDINGS: The heart size and mediastinal contours are stable. The lungs are
clear. There is no pleural effusion or pneumothorax.

The bowel gas pattern is nonobstructive. There is fluid within the
stomach and residual contrast material within the right colon. A
small amount of free intraperitoneal air is attributed to recent
abdominal surgery. Nonobstructing calculus in the lower pole of the
right kidney and intrauterine device are noted.
IMPRESSION: Minimal pneumoperitoneum attributed to recent abdominal surgery. No
evidence of bowel obstruction or other acute process.

## 2015-05-13 ENCOUNTER — Encounter: Payer: Self-pay | Admitting: Adult Health

## 2015-05-13 ENCOUNTER — Ambulatory Visit (INDEPENDENT_AMBULATORY_CARE_PROVIDER_SITE_OTHER): Payer: Managed Care, Other (non HMO) | Admitting: Adult Health

## 2015-05-13 VITALS — BP 108/72 | HR 58 | Ht 71.0 in | Wt 154.0 lb

## 2015-05-13 DIAGNOSIS — F419 Anxiety disorder, unspecified: Secondary | ICD-10-CM

## 2015-05-13 DIAGNOSIS — Z975 Presence of (intrauterine) contraceptive device: Secondary | ICD-10-CM | POA: Diagnosis not present

## 2015-05-13 DIAGNOSIS — N72 Inflammatory disease of cervix uteri: Secondary | ICD-10-CM | POA: Diagnosis not present

## 2015-05-13 MED ORDER — METRONIDAZOLE 0.75 % VA GEL: 1.0000 | Freq: Every day | VAGINAL | Status: DC

## 2015-05-13 MED ORDER — ESCITALOPRAM OXALATE 20 MG PO TABS: 20.0000 mg | ORAL_TABLET | Freq: Every day | ORAL | Status: DC

## 2015-05-13 NOTE — Patient Instructions (Signed)
Use metrogel for 5 nights Take lexapro 1 daily Follow up as needed

## 2015-05-13 NOTE — Progress Notes (Signed)
Subjective:     Patient ID: Alexandria Vincent, female   DOB: March 28, 1980, 35 y.o.   MRN: [id number]  HPI Eber JonesCarolyn is a 35 year old white female, married in with spotting with IUD again, it had stopped after doxycycline, and she stopped lexapro about 3 weeks ago when was sick.She [place] sold [place].She is still not working, but thinks she will resume when moved.Her oldest son has mood disorder and is on meds and doing better.   Review of Systems Patient denies any headaches, hearing loss, fatigue, blurred vision, shortness of breath, chest pain, abdominal pain, problems with bowel movements, urination, or intercourse. No joint pain or mood swings.See HPI for positives. Reviewed past medical,surgical, social and family history. Reviewed medications and allergies.     Objective:   Physical Exam BP 108/72 mmHg  Pulse 58  Ht 5\' 11"  (1.803 m)  Wt 154 lb (69.854 kg)  BMI 21.49 kg/m2 Skin warm and dry.Pelvic: external genitalia is normal in appearance no lesions, vagina: pink discharge without odor,urethra has no lesions or masses noted, cervix: bulbous friable with Q tip ,+IUD strings at os, uterus: normal size, shape and contour, non tender, no masses felt, adnexa: no masses or tenderness noted. Bladder is non tender and no masses felt   She is wanting to restart lexapro but at higher dose, due to increased stress and anxiety at home. She says she may want IUD out in future, and will call.Take some time for self, when moved in.Will increase lexapro to 20 mg, but take 10 mg for first few days. Face time 15 minutes, with 50% counseling.   Assessment:     Anxiety Cervicitis IUD in place     Plan:     Rx metrogel use in vagina at HS x 5 nights with 1 refill Rx lexapro 20 mg #30 take 1 daily with 6 refills  Follow up in November for physical or before if needed

## 2016-05-31 ENCOUNTER — Other Ambulatory Visit: Payer: Self-pay | Admitting: Adult Health

## 2017-02-10 ENCOUNTER — Ambulatory Visit (INDEPENDENT_AMBULATORY_CARE_PROVIDER_SITE_OTHER): Payer: BC Managed Care – PPO | Admitting: Advanced Practice Midwife

## 2017-02-10 ENCOUNTER — Encounter: Payer: Self-pay | Admitting: Advanced Practice Midwife

## 2017-02-10 VITALS — BP 110/70 | HR 69 | Ht 71.0 in | Wt 161.0 lb

## 2017-02-10 DIAGNOSIS — Z3202 Encounter for pregnancy test, result negative: Secondary | ICD-10-CM

## 2017-02-10 DIAGNOSIS — Z30432 Encounter for removal of intrauterine contraceptive device: Secondary | ICD-10-CM | POA: Diagnosis not present

## 2017-02-10 DIAGNOSIS — Z30011 Encounter for initial prescription of contraceptive pills: Secondary | ICD-10-CM

## 2017-02-10 LAB — POCT URINE PREGNANCY: PREG TEST UR: NEGATIVE

## 2017-02-10 MED ORDER — NORETHIN-ETH ESTRAD-FE BIPHAS 1 MG-10 MCG / 10 MCG PO TABS: 1.0000 | ORAL_TABLET | Freq: Every day | ORAL | 11 refills | Status: DC

## 2017-02-10 NOTE — Progress Notes (Signed)
  Alexandria ClickCarolyn Vincent 36 y.o.  Vitals:   02/10/17 1541  BP: 110/70  Pulse: 69   Past Medical History:  Diagnosis Date  . Abnormal Pap smear 2011   asc hpv  . Anxiety 11/26/2013  . BV (bacterial vaginosis) 01/28/2014  . Cervicitis 12/19/2014  . IBS (irritable bowel syndrome)   . Irregular bleeding 12/19/2014  . IUD (intrauterine device) in place 01/16/2013  . LLQ pain 12/19/2014  . Mental disorder    anxiety  . Migraine    "twice/month recently" (08/28/2013)  . Sinus infection 05/18/2012  . Skipped heart beats   . Vaginal discharge 01/28/2014   Past Surgical History:  Procedure Laterality Date  . APPENDECTOMY  08/25/2013   laparoscopic  . LAPAROSCOPIC APPENDECTOMY N/A 08/25/2013   Procedure: APPENDECTOMY LAPAROSCOPIC;  Surgeon: Atilano InaEric M Wilson, MD;  [facility];  Service: General;  Laterality: N/A;   family history includes Cancer in her maternal grandfather; Diabetes in her maternal grandfather; ODD in her son; Other in her maternal grandmother.  Current Outpatient Medications:  .  escitalopram (LEXAPRO) 20 MG tablet, TAKE 1 TABLET BY MOUTH EVERY DAY, Disp: 30 tablet, Rfl: 3 .  levonorgestrel (MIRENA) 20 MCG/24HR IUD, 1 each by Intrauterine route once., Disp: , Rfl:     Here for IUD removal.  She had the Mirena IUD placed a few years ago and would like it removed frequent vaginal (BV, yeast) infections).  Her plans for future contraception are undecided at this time Birth control options discussed:  COCs, depo, nuva ring, Nexplanon, and patch. Risks/benefits/side effects of each discussed.  Pt chooses LoLoestrin.  .  A graves speculum was placed, and the strings were visible.  They were grasped with a curved Tresa EndoKelly and the IUD easily removed.  Pt given IUD removal f/u instructions. Start COCs now, BU for 1 m onthl

## 2017-03-21 ENCOUNTER — Ambulatory Visit: Payer: BC Managed Care – PPO | Admitting: Family Medicine

## 2017-03-21 ENCOUNTER — Other Ambulatory Visit: Payer: Self-pay

## 2017-03-21 ENCOUNTER — Encounter: Payer: Self-pay | Admitting: Family Medicine

## 2017-03-21 VITALS — BP 120/74 | HR 81 | Temp 98.9°F | Resp 16 | Ht 71.0 in | Wt 162.0 lb

## 2017-03-21 DIAGNOSIS — F419 Anxiety disorder, unspecified: Secondary | ICD-10-CM | POA: Diagnosis not present

## 2017-03-21 DIAGNOSIS — Z23 Encounter for immunization: Secondary | ICD-10-CM

## 2017-03-21 MED ORDER — ESCITALOPRAM OXALATE 20 MG PO TABS: 20.0000 mg | ORAL_TABLET | Freq: Every day | ORAL | 3 refills | Status: DC

## 2017-03-21 NOTE — Progress Notes (Signed)
Chief Complaint  Patient presents with  . Anxiety    taking lexapro  . 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  Her biggest concern is anxiety.  She previously took Lexapro 20 mg a day.  She ran out of this 2 or 3 weeks ago.  Now she is feeling emotionally labile, easily upset, and having difficulty sleeping.  We discussed the need to stay on SSRI medications.  If she feels like she does not need it anymore, she should cut it in half and taper off slowly.  I did refill her Lexapro for her today. She is otherwise up-to-date with health care.  She sees Cyril MourningJennifer Griffin, NP, at the gynecology office.  She has birth control pills. She runs several times a week.  She is in good health. Immunizations are not up-to-date.  She is given a flu shot today.  Uncertain last tetanus   Patient Active Problem List   Diagnosis Date Noted  . Irregular bleeding 12/19/2014  . Anxiety 11/26/2013    Outpatient Encounter Medications as of 03/21/2017  Medication Sig  . escitalopram (LEXAPRO) 20 MG tablet Take 1 tablet (20 mg total) by mouth daily.  . Norethindrone-Ethinyl Estradiol-Fe Biphas (LO LOESTRIN FE) 1 MG-10 MCG / 10 MCG tablet Take 1 tablet by mouth daily.   No facility-administered encounter medications on file as of 03/21/2017.     Past Medical History:  Diagnosis Date  . Abnormal Pap smear 2011   asc hpv  . Anxiety 11/26/2013  . BV (bacterial vaginosis) 01/28/2014  . Cervicitis 12/19/2014  . IBS (irritable bowel syndrome)   . Irregular bleeding 12/19/2014  . IUD (intrauterine device) in place 01/16/2013  . LLQ pain 12/19/2014  . Mental disorder    anxiety  . Migraine    "twice/month recently" (08/28/2013)  . Sinus infection 05/18/2012  . Skipped heart beats   . Vaginal discharge 01/28/2014    Past Surgical History:  Procedure Laterality Date  . APPENDECTOMY  08/25/2013   laparoscopic  . LAPAROSCOPIC APPENDECTOMY N/A 08/25/2013   Procedure:  APPENDECTOMY LAPAROSCOPIC;  Surgeon: Atilano InaEric M Wilson, MD;  [facility];  Service: General;  Laterality: N/A;    Social History   Socioeconomic History  . Marital status: Married    Spouse name: Onalee HuaDavid  . Number of children: 2  . Years of education: Not on file  . Highest education level: Bachelor's degree (e.g., BA, AB, BS)  Social Needs  . Financial resource strain: Not very hard  . Food insecurity - worry: Never true  . Food insecurity - inability: Never true  . Transportation needs - medical: No  . Transportation needs - non-medical: No  Occupational History  . Occupation: 3rd grade  Tobacco Use  . Smoking status: Never Smoker  . Smokeless tobacco: Never Used  Substance and Sexual Activity  . Alcohol use: Yes    Comment: 08/28/2013 "glass of wine a couple times/month"  . Drug use: No  . Sexual activity: Yes    Birth control/protection: Pill  Other Topics Concern  . Not on file  Social History Narrative   Married   Two boys   3rd grade teacher   [place] in [place]    Family History  Problem Relation Age of Onset  . Cancer Maternal Grandfather        lung, bone  . Diabetes Maternal Grandfather   . Anxiety disorder Paternal Grandfather   . Mood Disorder Paternal Grandfather   .  Other Maternal Grandmother        cysts in breasts-noncancerous  . Hypertension Father   . Mood Disorder Father   . Mood Disorder Brother   . ADD / ADHD Son   . Anxiety disorder Son     Review of Systems  Constitutional: Negative for chills, fever and weight loss.  HENT: Negative for congestion and hearing loss.   Eyes: Negative for blurred vision and pain.  Respiratory: Negative for cough and shortness of breath.   Cardiovascular: Negative for chest pain and leg swelling.  Gastrointestinal: Negative for abdominal pain, constipation, diarrhea and heartburn.  Genitourinary: Negative for dysuria and frequency.  Musculoskeletal: Negative for falls, joint pain and myalgias.    Neurological: Negative for dizziness, seizures and headaches.  Psychiatric/Behavioral: Positive for depression. Negative for suicidal ideas. The patient is nervous/anxious and has insomnia.     BP 120/74 (BP Location: Left Arm, Patient Position: Sitting, Cuff Size: Normal)   Pulse 81   Temp 98.9 F (37.2 C) (Other (Comment))   Resp 16   Ht 5\' 11"  (1.803 m)   Wt 162 lb (73.5 kg)   LMP  (LMP Unknown)   SpO2 99%   BMI 22.59 kg/m   Physical Exam  Constitutional: She is oriented to person, place, and time. She appears well-developed and well-nourished.  HENT:  Head: Normocephalic and atraumatic.  Right Ear: External ear normal.  Left Ear: External ear normal.  Mouth/Throat: Oropharynx is clear and moist.  Eyes: Conjunctivae are normal. Pupils are equal, round, and reactive to light.  Neck: Normal range of motion. Neck supple. No thyromegaly present.  Cardiovascular: Normal rate, regular rhythm and normal heart sounds.  Pulmonary/Chest: Effort normal and breath sounds normal. No respiratory distress.  Abdominal: Soft. Bowel sounds are normal.  Musculoskeletal: Normal range of motion. She exhibits no edema.  Lymphadenopathy:    She has no cervical adenopathy.  Neurological: She is alert and oriented to person, place, and time.  Gait normal  Skin: Skin is warm and dry.  Psychiatric: She has a normal mood and affect. Her behavior is normal. Thought content normal.  Nursing note and vitals reviewed.   1. Immunization due Done - Flu Vaccine QUAD 6+ mos PF IM (Fluarix Quad PF)  2. Anxiety Discussed the mechanism of serotonin depletion, depression, anxiety.  Discussed medication use.  She was pleased with her response to Lexapro.  Will re-prescribe this for her.  Diet, exercise, sleep, taking care of herself is important.  She has 2 small children at home.  She will let me know if this does not improve her situation within 3-4 weeks.   Patient Instructions  Take the lexapro daily   Try melatonin to help sleep See me yearly Call sooner for problems Need flu shot every fall (optimally)   Eustace Moore, MD

## 2017-03-21 NOTE — Patient Instructions (Signed)
Take the lexapro daily  Try melatonin to help sleep See me yearly Call sooner for problems Need flu shot every fall (optimally)

## 2017-07-22 ENCOUNTER — Encounter: Payer: Self-pay | Admitting: Podiatry

## 2017-07-22 ENCOUNTER — Ambulatory Visit (INDEPENDENT_AMBULATORY_CARE_PROVIDER_SITE_OTHER): Payer: BC Managed Care – PPO

## 2017-07-22 ENCOUNTER — Ambulatory Visit: Payer: BC Managed Care – PPO | Admitting: Podiatry

## 2017-07-22 VITALS — BP 106/67 | HR 76 | Resp 16

## 2017-07-22 DIAGNOSIS — M722 Plantar fascial fibromatosis: Secondary | ICD-10-CM

## 2017-07-22 MED ORDER — TRIAMCINOLONE ACETONIDE 10 MG/ML IJ SUSP
10.0000 mg | Freq: Once | INTRAMUSCULAR | Status: AC
Start: 2017-07-22 — End: 2017-07-22
  Administered 2017-07-22: 10 mg

## 2017-07-22 MED ORDER — DICLOFENAC SODIUM 75 MG PO TBEC: 75.0000 mg | DELAYED_RELEASE_TABLET | Freq: Two times a day (BID) | ORAL | 1 refills | Status: DC

## 2017-07-22 NOTE — Progress Notes (Signed)
Subjective:   Patient ID: Alexandria Vincent, female   DOB: 37 y.o.   MRN: [id number]   HPI Patient presents with significant discomfort plantar aspect of the heel left over right of approximate 6 months duration.  She has started running over that time and the pain has intensified with her activity increasing.  Patient states that the left is worse than the right and patient does not smoke and likes to be active   Review of Systems  All other systems reviewed and are negative.       Objective:  Physical Exam  Constitutional: She appears well-developed and well-nourished.  Cardiovascular: Intact distal pulses.  Pulmonary/Chest: Effort normal.  Musculoskeletal: Normal range of motion.  Neurological: She is alert.  Skin: Skin is warm.  Nursing note and vitals reviewed.   Neurovascular status intact muscle strength is adequate range of motion within normal limits.  Patient is found to have exquisite discomfort plantar aspect heel left over right with inflammation fluid around the medial band and has moderate depression of the arch with family history of this problem.  Patient has good digital perfusion and is well oriented x3     Assessment:  Acute plantar fasciitis left over right heel     Plan:  H&P condition reviewed and today injected the plantar fascia left 3 mg Kenalog 5 mg Xylocaine applied fascial brace gave instructions for physical therapy and placed on diclofenac 75 mg twice daily.  Reappoint for [place] to recheck again in the next several weeks  X-ray indicates spur formation left over right with mild depression of the arch

## 2017-07-22 NOTE — Patient Instructions (Signed)

## 2017-08-05 ENCOUNTER — Encounter: Payer: Self-pay | Admitting: Family Medicine

## 2017-08-05 ENCOUNTER — Ambulatory Visit: Payer: BC Managed Care – PPO | Admitting: Podiatry

## 2017-08-05 ENCOUNTER — Encounter: Payer: Self-pay | Admitting: Podiatry

## 2017-08-05 DIAGNOSIS — M722 Plantar fascial fibromatosis: Secondary | ICD-10-CM | POA: Diagnosis not present

## 2017-08-05 NOTE — Patient Instructions (Signed)

## 2017-08-07 NOTE — Progress Notes (Signed)
Subjective:   Patient ID: Alexandria Vincent, female   DOB: 37 y.o.   MRN: [id number]   HPI Patient presents stating she is improving but she still having discomfort in the left heel and knows this is been present for fairly long time and she wants to be more active   ROS      Objective:  Physical Exam  Neurovascular status intact with continued discomfort plantar heel left over right with improvement but pain still upon deep palpation     Assessment:  Acute plantar fasciitis improved but present left over right     Plan:  H&P condition reviewed at great length and at this point I educated her on conservative and other treatments that are possible and the fact she will have to work on this from the chronic perspective.  I did go ahead today and I dispensed a night splint with instructions on usage discussed orthotics which we can hold on and not do permanently at this point but may be necessary and patient will be seen back to recheck again in the next 4 weeks.  I did spend a great deal time educating her on the deformity today

## 2017-08-11 ENCOUNTER — Other Ambulatory Visit: Payer: BC Managed Care – PPO | Admitting: Orthotics

## 2017-12-22 ENCOUNTER — Encounter: Payer: Self-pay | Admitting: Podiatry

## 2017-12-22 ENCOUNTER — Ambulatory Visit: Payer: BC Managed Care – PPO | Admitting: Podiatry

## 2017-12-22 DIAGNOSIS — M722 Plantar fascial fibromatosis: Secondary | ICD-10-CM

## 2017-12-22 MED ORDER — TRIAMCINOLONE ACETONIDE 10 MG/ML IJ SUSP
10.0000 mg | Freq: Once | INTRAMUSCULAR | Status: AC
  Administered 2017-12-22: 10 mg

## 2017-12-22 NOTE — Patient Instructions (Signed)
Pre-Operative Instructions  Congratulations, you have decided to take an important step towards improving your quality of life.  You can be assured that the doctors and staff at [facility] will be with you every step of the way.  Here are some important things you should know:  1. Plan to be at the surgery center/hospital at least 1 (one) hour prior to your scheduled time, unless otherwise directed by the surgical center/hospital staff.  You must have a responsible adult accompany you, remain during the surgery and drive you home.  Make sure you have directions to the surgical center/hospital to ensure you arrive on time. 2. If you are having surgery at [facility] or [facility], you will need a copy of your medical history and physical form from your family physician within one month prior to the date of surgery. We will give you a form for your primary physician to complete.  3. We make every effort to accommodate the date you request for surgery.  However, there are times where surgery dates or times have to be moved.  We will contact you as soon as possible if a change in schedule is required.   4. No aspirin/ibuprofen for one week before surgery.  If you are on aspirin, any non-steroidal anti-inflammatory medications (Mobic, Aleve, Ibuprofen) should not be taken seven (7) days prior to your surgery.  You make take Tylenol for pain prior to surgery.  5. Medications - If you are taking daily heart and blood pressure medications, seizure, reflux, allergy, asthma, anxiety, pain or diabetes medications, make sure you notify the surgery center/hospital before the day of surgery so they can tell you which medications you should take or avoid the day of surgery. 6. No food or drink after midnight the night before surgery unless directed otherwise by surgical center/hospital staff. 7. No alcoholic beverages 24-hours prior to surgery.  No smoking 24-hours prior or 24-hours after  surgery. 8. Wear loose pants or shorts. They should be loose enough to fit over bandages, boots, and casts. 9. Don't wear slip-on shoes. Sneakers are preferred. 10. Bring your boot with you to the surgery center/hospital.  Also bring crutches or a walker if your physician has prescribed it for you.  If you do not have this equipment, it will be provided for you after surgery. 11. If you have not been contacted by the surgery center/hospital by the day before your surgery, call to confirm the date and time of your surgery. 12. Leave-time from work may vary depending on the type of surgery you have.  Appropriate arrangements should be made prior to surgery with your employer. 13. Prescriptions will be provided immediately following surgery by your doctor.  Fill these as soon as possible after surgery and take the medication as directed. Pain medications will not be refilled on weekends and must be approved by the doctor. 14. Remove nail polish on the operative foot and avoid getting pedicures prior to surgery. 15. Wash the night before surgery.  The night before surgery wash the foot and leg well with water and the antibacterial soap provided. Be sure to pay special attention to beneath the toenails and in between the toes.  Wash for at least three (3) minutes. Rinse thoroughly with water and dry well with a towel.  Perform this wash unless told not to do so by your physician.  Enclosed: 1 Ice pack (please put in freezer the night before surgery)   1 Hibiclens skin cleaner     Pre-op instructions  If you have any questions regarding the instructions, please do not hesitate to call our office.  [place]: [address], [place], [place] [address] -- [phone]  [place]: [address] [place], [place] [address] -- [phone]  [place]: [address]  [place], [place] [address] -- [phone]  [place]: [address], [address], [place],  [address] -- [phone]  Website:  [email or web address] 

## 2017-12-23 NOTE — Progress Notes (Signed)
Subjective:   Patient ID: Alexandria Vincent, female   DOB: 37 y.o.   MRN: [id number]   HPI Patient is having a flareup on the bottom of the left heel stating its been extremely sore and she truly is only getting a month or 2 of relief and states that her activity levels and her ability to be active are severely curtailed secondary to the pain   ROS      Objective:  Physical Exam  Neurovascular status intact with patient found to have severe discomfort medial fascial band left at the insertional point of the tendon into the calcaneus with fluid buildup around the medial band     Assessment:  Severe chronic long-term plantar fasciitis which has not responded to conservative treatment     Plan:  H&P and reviewed condition at great length.  Patient is not mobile at this time is having increased pain with this and I do think long-term this will be best corrected.  I reviewed this in great length with her and patient agrees and wants surgery and I reviewed the surgery the complications associated with it and alternative treatments.  Patient understands that there is no long-term guarantees and she may develop some significant arch pain or lateral foot pain which can be extensive and take time to get better secondary to fascial release.  Patient is willing to accept risk and signed consent form after extensive review and is scheduled for outpatient surgery and to try to give her relief for the next 4 to 6 weeks before procedure I did do one more injection plantar fascia 3 mg Kenalog 5 mg Xylocaine as she has to be very active.  I also dispensed air fracture walker with all instructions on usage and encouraged her to call with any questions concerns that she may have prior to procedure

## 2018-01-24 ENCOUNTER — Encounter: Payer: Self-pay | Admitting: Podiatry

## 2018-01-24 DIAGNOSIS — M722 Plantar fascial fibromatosis: Secondary | ICD-10-CM

## 2018-02-01 ENCOUNTER — Ambulatory Visit (INDEPENDENT_AMBULATORY_CARE_PROVIDER_SITE_OTHER): Payer: Self-pay | Admitting: Podiatry

## 2018-02-01 ENCOUNTER — Encounter: Payer: Self-pay | Admitting: Podiatry

## 2018-02-01 DIAGNOSIS — M722 Plantar fascial fibromatosis: Secondary | ICD-10-CM | POA: Diagnosis not present

## 2018-02-02 NOTE — Progress Notes (Signed)
Subjective:   Patient ID: Alexandria Vincent, female   DOB: 37 y.o.   MRN: [id number]   HPI Patient presents stating he was doing real well with minimal discomfort and very pleased so far   ROS      Objective:  Physical Exam  Neurovascular status intact negative Homans sign noted with wound edges well coapted medial lateral side healed with reduced inflammation noted and minimal plantar discomfort in the heel with mild discomfort in the arch     Assessment:  Doing well post endoscopic surgery left     Plan:  Reapplied sterile dressing continue with boot usage and reappoint 2 weeks for suture removal or earlier if needed

## 2018-02-10 ENCOUNTER — Other Ambulatory Visit: Payer: Self-pay

## 2018-02-10 NOTE — Progress Notes (Signed)
DOS  01/24/2018  Endoscopic release medial band left heel.

## 2018-02-15 ENCOUNTER — Encounter: Payer: Self-pay | Admitting: Podiatry

## 2018-02-15 ENCOUNTER — Ambulatory Visit (INDEPENDENT_AMBULATORY_CARE_PROVIDER_SITE_OTHER): Payer: BC Managed Care – PPO | Admitting: Podiatry

## 2018-02-15 DIAGNOSIS — M722 Plantar fascial fibromatosis: Secondary | ICD-10-CM

## 2018-02-15 NOTE — Progress Notes (Signed)
Subjective:   Patient ID: Alexandria Vincent, female   DOB: 37 y.o.   MRN: [id number]   HPI Patient states having pain a 5 on my foot too much but overall doing okay   ROS      Objective:  Physical Exam  Neurovascular status intact with patient's heel healing well wound edges well coapted minimal plantar pain noted currently     Assessment:  Doing well post endoscopic surgery with moderate discomfort in the other heel and pain in the arch if she is on it too much     Plan:  Stitches are removed instructed on continued immobilization apply ankle compression stocking and placed on diclofenac 75 mg twice daily to help keep the inflammation under control.  Reappoint for us to recheck in the next 4 to 6 weeks or as needed

## 2019-06-21 ENCOUNTER — Telehealth: Payer: Self-pay | Admitting: Adult Health

## 2019-06-21 NOTE — Telephone Encounter (Signed)

## 2019-06-25 ENCOUNTER — Encounter: Payer: Self-pay | Admitting: Adult Health

## 2019-06-25 ENCOUNTER — Other Ambulatory Visit (HOSPITAL_COMMUNITY)
Admission: RE | Admit: 2019-06-25 | Discharge: 2019-06-25 | Disposition: A | Discharge status: Home / Self Care | Payer: BC Managed Care – PPO | Source: Ambulatory Visit | Attending: Adult Health | Admitting: Adult Health

## 2019-06-25 ENCOUNTER — Ambulatory Visit (INDEPENDENT_AMBULATORY_CARE_PROVIDER_SITE_OTHER): Payer: BC Managed Care – PPO | Admitting: Adult Health

## 2019-06-25 ENCOUNTER — Other Ambulatory Visit: Payer: Self-pay

## 2019-06-25 VITALS — BP 132/82 | HR 53 | Ht 71.0 in | Wt 184.0 lb

## 2019-06-25 DIAGNOSIS — Z1151 Encounter for screening for human papillomavirus (HPV): Secondary | ICD-10-CM | POA: Diagnosis not present

## 2019-06-25 DIAGNOSIS — Z01419 Encounter for gynecological examination (general) (routine) without abnormal findings: Secondary | ICD-10-CM | POA: Insufficient documentation

## 2019-06-25 DIAGNOSIS — Z30011 Encounter for initial prescription of contraceptive pills: Secondary | ICD-10-CM | POA: Insufficient documentation

## 2019-06-25 MED ORDER — LO LOESTRIN FE 1 MG-10 MCG / 10 MCG PO TABS: 1.0000 | ORAL_TABLET | Freq: Every day | ORAL | 0 refills | Status: DC

## 2019-06-25 NOTE — Progress Notes (Signed)
Patient ID: Alexandria Vincent, female   DOB: 17-Feb-1981, 39 y.o.   MRN: [id number] History of Present Illness: Alexandria Vincent is a 39 year old white female,married, G2P2 in for well woman gyn exam and pap, last pa[ was normal 01/28/14. 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    Current Medications, Allergies, Past Medical History, Past Surgical History, Family History and Social History were reviewed in [employer] record.     Review of Systems: Patient denies any headaches, hearing loss, fatigue, blurred vision, shortness of breath, chest pain, abdominal pain, problems with bowel movements, urination, or intercourse. No joint pain or mood swings. Has some night sweats before periods.     Physical Exam:BP 132/82 (BP Location: Left Arm, Patient Position: Sitting, Cuff Size: Normal)   Pulse (!) 53   Ht 5\' 11"  (1.803 m)   Wt 184 lb (83.5 kg)   LMP 06/04/2019   BMI 25.66 kg/m  General:  Well developed, well nourished, no acute distress Skin:  Warm and dry Neck:  Midline trachea, normal thyroid, good ROM, no lymphadenopathy Lungs; Clear to auscultation bilaterally Breast:  No dominant palpable mass, retraction, or nipple discharge Cardiovascular: Regular rate and rhythm Abdomen:  Soft, non tender, no hepatosplenomegaly Pelvic:  External genitalia is normal in appearance, no lesions.  The vagina is normal in appearance. Urethra has no lesions or masses. The cervix is bulbous.Pap with high risk HPV 16/18 genotyping performed.  Uterus is felt to be normal size, shape, and contour.  No adnexal masses or tenderness noted.Bladder is non tender, no masses felt. Extremities/musculoskeletal:  No swelling or varicosities noted, no clubbing or cyanosis Psych:  No mood changes, alert and cooperative,seems happy Fall risk is low PHQ 9 score is 3,has xanax prn anxiety, from PCP Examination chaperoned by 08/04/2019 LPN  Impression and Plan: 1. Encounter for gynecological examination with Papanicolaou smear of cervix Pap sent Physical in 1 year Pap in 3 if normal Mammogram at 42.   2. Encounter for initial prescription of contraceptive pills Will try lo loestrin, 4 packs given to start with next period and use condoms for 1 pack. Meds ordered this encounter  Medications  . Norethindrone-Ethinyl Estradiol-Fe Biphas (LO LOESTRIN FE) 1 MG-10 MCG / 10 MCG tablet    Sig: Take 1 tablet by mouth daily. Take 1 daily by mouth    Dispense:  4 Package    Refill:  0    BIN Faith Rogue, PCN CN, GRP [id number]    Order Specific Question:   Supervising Provider    Answer:   [id number] [[id number]  Follow up in 3 months

## 2019-06-27 LAB — CYTOLOGY - PAP
Comment: NEGATIVE
Diagnosis: NEGATIVE
High risk HPV: NEGATIVE

## 2019-09-24 ENCOUNTER — Ambulatory Visit: Payer: BC Managed Care – PPO | Admitting: Adult Health

## 2020-02-08 ENCOUNTER — Ambulatory Visit: Payer: BC Managed Care – PPO | Admitting: Podiatry

## 2022-12-14 ENCOUNTER — Encounter: Payer: Self-pay | Admitting: Urgent Care

## 2022-12-14 ENCOUNTER — Ambulatory Visit: Payer: BC Managed Care – PPO | Admitting: Urgent Care

## 2022-12-14 VITALS — BP 126/87 | HR 63 | Temp 97.9°F | Ht 69.0 in | Wt 198.0 lb

## 2022-12-14 DIAGNOSIS — H6122 Impacted cerumen, left ear: Secondary | ICD-10-CM | POA: Diagnosis not present

## 2022-12-14 DIAGNOSIS — J069 Acute upper respiratory infection, unspecified: Secondary | ICD-10-CM | POA: Diagnosis not present

## 2022-12-14 DIAGNOSIS — H6993 Unspecified Eustachian tube disorder, bilateral: Secondary | ICD-10-CM | POA: Diagnosis not present

## 2022-12-14 DIAGNOSIS — R051 Acute cough: Secondary | ICD-10-CM | POA: Diagnosis not present

## 2022-12-14 DIAGNOSIS — F413 Other mixed anxiety disorders: Secondary | ICD-10-CM

## 2022-12-14 DIAGNOSIS — R0982 Postnasal drip: Secondary | ICD-10-CM | POA: Diagnosis not present

## 2022-12-14 MED ORDER — ESCITALOPRAM OXALATE 10 MG PO TABS
10.0000 mg | ORAL_TABLET | Freq: Every day | ORAL | 0 refills | Status: DC
Start: 2022-12-14 — End: 2023-04-28

## 2022-12-14 MED ORDER — MONTELUKAST SODIUM 10 MG PO TABS: 10.0000 mg | ORAL_TABLET | Freq: Every day | ORAL | 0 refills | Status: DC

## 2022-12-14 MED ORDER — AZITHROMYCIN 250 MG PO TABS
ORAL_TABLET | ORAL | 0 refills | Status: AC
Start: 2022-12-14 — End: 2022-12-19

## 2022-12-14 MED ORDER — BENZONATATE 100 MG PO CAPS
100.0000 mg | ORAL_CAPSULE | Freq: Three times a day (TID) | ORAL | 0 refills | Status: DC | PRN
Start: 2022-12-14 — End: 2023-12-01

## 2022-12-14 MED ORDER — AZELASTINE-FLUTICASONE 137-50 MCG/ACT NA SUSP: 2.0000 | Freq: Two times a day (BID) | NASAL | 0 refills | Status: DC

## 2022-12-14 NOTE — Progress Notes (Signed)
New Patient Office Visit  Subjective:  Patient ID: Alexandria Vincent, female    DOB: 08-14-1980  Age: 42 y.o. MRN: [id number]  CC:  Chief Complaint  Patient presents with   Establish Care    New pt get est. She has been dealing with sinus issues for a few weeks. She went to urgent care last week and was told her ears were full. She states her head feels like she's in a bubble.    HPI Alexandria Vincent presents to establish care  Pleasant 41yo female presents to establish care. She was previously following with a provider at [facility], but states this medical clinic shut down. She is unable to obtain her medical records.  She is here today primarily concerned about a cough, sinus pressure, ear pressure, drainage and headache that she has had for more than three weeks. She did go to an Urgent Care on 12/07/22 and was prescribed Augmentin BID and prednisone. She states she has been taking both as prescribed but there has been no improvement in her symptoms. She denies hx of allergies, denies known inciting event. She is a [employer] officer. She has been taking OTC meds as well including Tylenol and goody powder. Denies SOB, DOE, CP, fever. Most of her sx are in her head. Coughing at night due to drainage, states poor sleep due to this.  Pt reports issues with her ears, primarily on the L side. States she has been trying to flush it out without success.  Pt does report hx of anxiety. Was on lexapro in the past and feels that this was very effective for her symptoms. She denies any SI/HI/AVH. Reports minimal depressive sx, primarily anxiety. Has been off SSRI therapy for >6 months due to lack of having a PCP. Would like this refilled today. Reports tolerance of the medication in the past.    Outpatient Encounter Medications as of 12/14/2022  Medication Sig   Azelastine-Fluticasone (DYMISTA) 137-50 MCG/ACT SUSP Place 2 sprays into the nose every 12 (twelve) hours.   azithromycin (ZITHROMAX) 250  MG tablet Take 2 tablets on day 1, then 1 tablet daily on days 2 through 5   benzonatate (TESSALON PERLES) 100 MG capsule Take 1 capsule (100 mg total) by mouth 3 (three) times daily as needed for cough.   escitalopram (LEXAPRO) 10 MG tablet Take 1 tablet (10 mg total) by mouth daily.   montelukast (SINGULAIR) 10 MG tablet Take 1 tablet (10 mg total) by mouth at bedtime.   ALPRAZolam (XANAX) 0.5 MG tablet Take by mouth. (Patient not taking: Reported on 12/14/2022)   Norethindrone-Ethinyl Estradiol-Fe Biphas (LO LOESTRIN FE) 1 MG-10 MCG / 10 MCG tablet Take 1 tablet by mouth daily. Take 1 daily by mouth (Patient not taking: Reported on 12/14/2022)   No facility-administered encounter medications on file as of 12/14/2022.    Past Medical History:  Diagnosis Date   Abnormal Pap smear 2011   asc hpv   Anxiety 11/26/2013   BV (bacterial vaginosis) 01/28/2014   Cervicitis 12/19/2014   IBS (irritable bowel syndrome)    Irregular bleeding 12/19/2014   IUD (intrauterine device) in place 01/16/2013   LLQ pain 12/19/2014   Mental disorder    anxiety   Migraine    "twice/month recently" (08/28/2013)   Sinus infection 05/18/2012   Skipped heart beats    Vaginal discharge 01/28/2014    Past Surgical History:  Procedure Laterality Date   APPENDECTOMY  08/25/2013   laparoscopic   LAPAROSCOPIC APPENDECTOMY  N/A 08/25/2013   Procedure: APPENDECTOMY LAPAROSCOPIC;  Surgeon: Atilano Ina, MD;  Location: [facility] OR;  Service: General;  Laterality: N/A;    Family History  Problem Relation Age of Onset   Cancer Maternal Grandfather        lung, bone   Diabetes Maternal Grandfather    Anxiety disorder Paternal Grandfather    Mood Disorder Paternal Grandfather    Other Maternal Grandmother        cysts in breasts-noncancerous   Hypertension Father    Mood Disorder Father    Mood Disorder Brother    ADD / ADHD Son    Anxiety disorder Son     Social History   Socioeconomic History   Marital  status: Married    Spouse name: Onalee Hua   Number of children: 2   Years of education: Not on file   Highest education level: Bachelor's degree (e.g., BA, AB, BS)  Occupational History   Occupation: 3rd grade  Tobacco Use   Smoking status: Never   Smokeless tobacco: Never  Vaping Use   Vaping status: Never Used  Substance and Sexual Activity   Alcohol use: Yes    Comment: 08/28/2013 "glass of wine a couple times/month"   Drug use: No   Sexual activity: Yes    Birth control/protection: None  Other Topics Concern   Not on file  Social History Narrative   Married   Two boys   3rd grade teacher   Lives in [place]   Social Determinants of Health   Financial Resource Strain: Low Risk  (04/20/2021)   Received from [facility]   Overall Financial Resource Strain (CARDIA)    Difficulty of Paying Living Expenses: Not very hard  Food Insecurity: No Food Insecurity (04/20/2021)   Received from [facility]   Hunger Vital Sign    Worried About Running Out of Food in the Last Year: Never true    Ran Out of Food in the Last Year: Never true  Transportation Needs: No Transportation Needs (04/20/2021)   Received from [facility] - Transportation    Lack of Transportation (Medical): No    Lack of Transportation (Non-Medical): No  Physical Activity: Sufficiently Active (04/20/2021)   Received from [facility]   Exercise Vital Sign    Days of Exercise per Week: 5 days    Minutes of Exercise per Session: 50 min  Stress: Stress Concern Present (04/20/2021)   Received from [facility] - Occupational Stress Questionnaire    Feeling of Stress : Very much  Social Connections: Unknown (07/01/2021)   Received from [facility]   Social Network    Social Network: Not on file  Intimate Partner Violence: Unknown (06/04/2021)   Received from [facility]   HITS    Physically Hurt: Not on file    Insult or Talk Down To: Not on file     Threaten Physical Harm: Not on file    Scream or Curse: Not on file    ROS: as noted in HPI  Objective:  BP 126/87   Pulse 63   Temp 97.9 F (36.6 C) (Oral)   Ht 5\' 9"  (1.753 m)   Wt 198 lb (89.8 kg)   SpO2 97%   BMI 29.24 kg/m   Physical Exam Vitals and nursing note reviewed. Exam conducted with a chaperone present (son).  Constitutional:      General: She is not in acute distress.  Appearance: Normal appearance. She is not ill-appearing, toxic-appearing or diaphoretic.  HENT:     Head: Normocephalic and atraumatic.     Salivary Glands: Right salivary gland is not diffusely enlarged or tender. Left salivary gland is not diffusely enlarged or tender.     Right Ear: No drainage, swelling or tenderness. No middle ear effusion. There is no impacted cerumen. No mastoid tenderness. Tympanic membrane is not injected, scarred, perforated, erythematous or bulging.     Left Ear: Decreased hearing (improved after cerumen removal) noted. No drainage, swelling or tenderness. A middle ear effusion is present. There is impacted cerumen. No mastoid tenderness. Tympanic membrane is injected. Tympanic membrane is not scarred, perforated, erythematous or bulging.     Nose: Congestion and rhinorrhea present. No nasal tenderness.     Right Turbinates: Enlarged and swollen.     Left Turbinates: Enlarged and swollen.     Right Sinus: Maxillary sinus tenderness and frontal sinus tenderness present.     Left Sinus: Maxillary sinus tenderness and frontal sinus tenderness present.     Mouth/Throat:     Lips: Pink.     Mouth: Mucous membranes are moist. No oral lesions.     Tongue: No lesions.     Palate: No mass.     Pharynx: Oropharynx is clear. Uvula midline. Postnasal drip present. No pharyngeal swelling, oropharyngeal exudate, posterior oropharyngeal erythema or uvula swelling.  Cardiovascular:     Rate and Rhythm: Normal rate.  Pulmonary:     Effort: Pulmonary effort is normal. No respiratory  distress.     Breath sounds: Normal breath sounds. No stridor. No wheezing, rhonchi or rales.  Musculoskeletal:     Cervical back: Normal range of motion and neck supple. No rigidity or tenderness.  Lymphadenopathy:     Cervical: No cervical adenopathy.  Skin:    General: Skin is warm.     Findings: No rash.  Neurological:     General: No focal deficit present.     Mental Status: She is alert and oriented to person, place, and time.      Ear Cerumen Removal  Date/Time: 12/14/2022 2:52 PM  Performed by: Maretta Bees, PA Authorized by: Maretta Bees, PA   Anesthesia: Local Anesthetic: none Location details: left ear Patient tolerance: patient tolerated the procedure well with no immediate complications Comments: Ear canal clear without residual wax. No bleeding. TM intact without evidence of infection. Procedure type: irrigation       Assessment & Plan:  Upper respiratory tract infection, unspecified type -     Azelastine-Fluticasone; Place 2 sprays into the nose every 12 (twelve) hours.  Dispense: 23 g; Refill: 0 -     Montelukast Sodium; Take 1 tablet (10 mg total) by mouth at bedtime.  Dispense: 30 tablet; Refill: 0 -     Azithromycin; Take 2 tablets on day 1, then 1 tablet daily on days 2 through 5  Dispense: 6 tablet; Refill: 0 -     Benzonatate; Take 1 capsule (100 mg total) by mouth 3 (three) times daily as needed for cough.  Dispense: 20 capsule; Refill: 0  Dysfunction of both eustachian tubes -     Azelastine-Fluticasone; Place 2 sprays into the nose every 12 (twelve) hours.  Dispense: 23 g; Refill: 0 -     Montelukast Sodium; Take 1 tablet (10 mg total) by mouth at bedtime.  Dispense: 30 tablet; Refill: 0 -     Azithromycin; Take 2 tablets on day 1, then 1  tablet daily on days 2 through 5  Dispense: 6 tablet; Refill: 0  Acute cough -     Montelukast Sodium; Take 1 tablet (10 mg total) by mouth at bedtime.  Dispense: 30 tablet; Refill: 0 -     Azithromycin;  Take 2 tablets on day 1, then 1 tablet daily on days 2 through 5  Dispense: 6 tablet; Refill: 0 -     Benzonatate; Take 1 capsule (100 mg total) by mouth 3 (three) times daily as needed for cough.  Dispense: 20 capsule; Refill: 0  Post-nasal drainage -     Azelastine-Fluticasone; Place 2 sprays into the nose every 12 (twelve) hours.  Dispense: 23 g; Refill: 0 -     Montelukast Sodium; Take 1 tablet (10 mg total) by mouth at bedtime.  Dispense: 30 tablet; Refill: 0 -     Azithromycin; Take 2 tablets on day 1, then 1 tablet daily on days 2 through 5  Dispense: 6 tablet; Refill: 0 -     Benzonatate; Take 1 capsule (100 mg total) by mouth 3 (three) times daily as needed for cough.  Dispense: 20 capsule; Refill: 0  Other mixed anxiety disorders -     Escitalopram Oxalate; Take 1 tablet (10 mg total) by mouth daily.  Dispense: 90 tablet; Refill: 0  Impacted cerumen of left ear -     Ear Cerumen Removal   Pt presents with uncontrolled URI sx x 3+ weeks. 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 Pt has had no relief with steroids or Augmentin. Will DC current abx and switch to Zpack to cover for atypicals and anti-inflammatory property of medication. Add Dymista nasal spray to open eustachian tubes. Montelukast nightly to help with cough and URI sx. PRN tessalon pearles recommended.  Pt also wanting to establish care. Requesting refill of SSRI. Appears most recent may have been zoloft, however pt also was on lexapro for several years. She is requesting refill of this today for her anxiety.   Pt to return in several weeks to perform annual PE, fasting labs and pap smear.   Return in about 8 weeks (around 02/08/2023) for Annual Physical.   Maretta Bees, PA

## 2022-12-14 NOTE — Patient Instructions (Addendum)
Please start taking the antibiotic, Azithromycin, per package instructions. You will take two tabs today, followed by one tab daily on day 2-5.  Start taking montelukast once every night before bed to help clear up the mucous. Use two sprays of Dymista twice daily to help with inflammation of the nasal passage and to help clear your eustachian tubes.  It is also recommended that you continue use of nasal saline/ sinus washes to cleans the sinus passages. Hot steam from a shower or vaporizer may also be beneficial to help open up the upper airway. Eucalyptus can be helpful.  For your cough, I have called in tessalon pearles. You can take this every 8 hours as needed.  I have refilled your lexapro as requested. Your previous dose was 20mg , however we typically prefer to start at 10mg  and titrate up if needed. Take this once daily.  Please return to schedule an annual PE and fasting labs. We can update your pap smear as well.   So great to meet you and enjoy your anniversary!! :)

## 2023-02-24 ENCOUNTER — Encounter: Payer: Self-pay | Admitting: Urgent Care

## 2023-02-24 ENCOUNTER — Ambulatory Visit: Payer: BC Managed Care – PPO | Admitting: Urgent Care

## 2023-02-24 VITALS — BP 124/85 | HR 84 | Wt 196.0 lb

## 2023-02-24 DIAGNOSIS — S29012A Strain of muscle and tendon of back wall of thorax, initial encounter: Secondary | ICD-10-CM | POA: Diagnosis not present

## 2023-02-24 MED ORDER — KETOROLAC TROMETHAMINE 10 MG PO TABS
10.0000 mg | ORAL_TABLET | Freq: Four times a day (QID) | ORAL | 0 refills | Status: DC | PRN
Start: 2023-02-24 — End: 2023-04-28

## 2023-02-24 MED ORDER — KETOROLAC TROMETHAMINE 30 MG/ML IJ SOLN
60.0000 mg | Freq: Once | INTRAMUSCULAR | Status: AC
Start: 2023-02-24 — End: 2023-02-24
  Administered 2023-02-24: 60 mg via INTRAMUSCULAR

## 2023-02-24 MED ORDER — CARISOPRODOL 350 MG PO TABS
350.0000 mg | ORAL_TABLET | Freq: Three times a day (TID) | ORAL | 0 refills | Status: DC | PRN
Start: 2023-02-24 — End: 2023-08-09

## 2023-02-24 NOTE — Patient Instructions (Addendum)
You have cervical / thoracic trigger point, which is knots in the muscles of the neck / back.  Please apply a warm moist compress, such as a microwavable heating pack, to your neck several times daily. A epsom salt bath would also provide you with relief.   After each warm compress, apply the technique that we discussed today of ischemic release. This is a prolonged, deep pressure into the knot of the muscle to release the tension.  Take the muscle relaxer three times daily as needed. Keep in mind it may make you feel tired or drowsy, so do not operate machinery or drive a car until you know how it affects you.  Please take the anti-inflammatory medication called in today up to four times daily with food. Do not take any additional OTC NSAIDS (advil, motrin, ibuprofen, aleve, naproxen).  DO NOT EXCEED 4 days of use, even if you have pills remaining. Discard after 02/28/23.  If your symptoms persist, you would be a candidate for trigger point injection or dry needling.  Try to stay hydrated with WATER as dehydration and caffeine intake can worsen this condition.  If you develop worsening pain, fever, or any new symptoms, please return to the clinic.     Please schedule your annual physical within the next two months. Come fasting for labs.

## 2023-02-24 NOTE — Progress Notes (Signed)
Established Patient Office Visit  Subjective:  Patient ID: Alexandria Vincent, female    DOB: Jul 06, 1980  Age: 42 y.o. MRN: [id number]  Chief Complaint  Patient presents with   Back Pain    She stated she was laying in bed yesterday and she thinks she may have turned wrong and has been having bad upper back pain    Pleasant 41yo female, a runner with no known chronic back issues, presented with acute onset of severe upper back and neck pain that started a couple of days prior. Initially, the discomfort was mild and localized to the top part of the neck, which did not hinder her running routine. However, the pain intensified and spread to the entire back, shoulders, and hands after a sudden movement while playing with her dog in bed. The pain was so severe that it induced nausea and nearly caused the patient to pass out. The patient also reported a popping sound during the episode and experienced tingling in her fingers.  The pain was described as deep inside, rather than muscular, and was exacerbated by certain movements, particularly looking down and lifting the arms. The patient also reported difficulty in performing routine tasks such as spraying deodorant and picking up objects due to the pain. The patient had tried managing the pain with over-the-counter ibuprofen and alternating heat and ice, with limited relief. The patient denied any changes in bowel or bladder habits or any tingling sensation in the groin.   Back Pain    Patient Active Problem List   Diagnosis Date Noted   Encounter for initial prescription of contraceptive pills 06/25/2019   Encounter for gynecological examination with Papanicolaou smear of cervix 06/25/2019   Irregular bleeding 12/19/2014   Anxiety 11/26/2013   Past Medical History:  Diagnosis Date   Abnormal Pap smear 2011   asc hpv   Anxiety 11/26/2013   BV (bacterial vaginosis) 01/28/2014   Cervicitis 12/19/2014   IBS (irritable bowel syndrome)     Irregular bleeding 12/19/2014   IUD (intrauterine device) in place 01/16/2013   LLQ pain 12/19/2014   Mental disorder    anxiety   Migraine    "twice/month recently" (08/28/2013)   Sinus infection 05/18/2012   Skipped heart beats    Vaginal discharge 01/28/2014   Social History   Tobacco Use   Smoking status: Never   Smokeless tobacco: Never  Vaping Use   Vaping status: Never Used  Substance Use Topics   Alcohol use: Yes    Comment: 08/28/2013 "glass of wine a couple times/month"   Drug use: No      ROS: as noted in HPI  Objective:     BP 124/85   Pulse 84   Wt 196 lb (88.9 kg)   SpO2 100%   BMI 28.94 kg/m  BP Readings from Last 3 Encounters:  02/24/23 124/85  12/14/22 126/87  06/25/19 132/82   Wt Readings from Last 3 Encounters:  02/24/23 196 lb (88.9 kg)  12/14/22 198 lb (89.8 kg)  06/25/19 184 lb (83.5 kg)      Physical Exam Vitals and nursing note reviewed.  Constitutional:      General: She is not in acute distress.    Appearance: Normal appearance. She is not ill-appearing, toxic-appearing or diaphoretic.  HENT:     Head: Normocephalic and atraumatic.     Mouth/Throat:     Mouth: Mucous membranes are moist.  Eyes:     General: No scleral icterus.  Right eye: No discharge.        Left eye: No discharge.     Extraocular Movements: Extraocular movements intact.     Pupils: Pupils are equal, round, and reactive to light.  Neck:     Thyroid: No thyroid mass, thyromegaly or thyroid tenderness.     Meningeal: Brudzinski's sign absent.      Comments: Negative Spurling test Cardiovascular:     Rate and Rhythm: Normal rate and regular rhythm.  Pulmonary:     Effort: Pulmonary effort is normal. No respiratory distress.     Breath sounds: Normal breath sounds. No stridor.  Musculoskeletal:        General: Tenderness (to painful, knotted traps, rhomboids and paracervicals bilaterally) present. No swelling. Normal range of motion.     Cervical  back: Normal range of motion and neck supple. Tenderness (paracervicals and traps bilaterally) present. No rigidity or torticollis. Pain with movement and muscular tenderness present. No spinous process tenderness. Normal range of motion.  Lymphadenopathy:     Cervical: No cervical adenopathy.     Right cervical: No superficial, deep or posterior cervical adenopathy.    Left cervical: No superficial, deep or posterior cervical adenopathy.  Skin:    General: Skin is warm and dry.     Coloration: Skin is not jaundiced.     Findings: No bruising, erythema or rash.  Neurological:     General: No focal deficit present.     Mental Status: She is alert and oriented to person, place, and time.     Cranial Nerves: No cranial nerve deficit.     Sensory: No sensory deficit.     Motor: No weakness.     Gait: Gait normal.  Psychiatric:        Mood and Affect: Mood normal.        Behavior: Behavior normal.      No results found for any visits on 02/24/23.  Last metabolic panel Lab Results  Component Value Date   GLUCOSE 89 08/28/2013   NA 143 08/28/2013   K 4.0 08/28/2013   CL 105 08/28/2013   CO2 25 08/28/2013   BUN 11 08/28/2013   CREATININE 0.88 08/28/2013   GFRNONAA 87 (L) 08/28/2013   CALCIUM 9.1 08/28/2013   PROT 6.9 08/28/2013   ALBUMIN 3.5 08/28/2013   BILITOT 0.2 (L) 08/28/2013   ALKPHOS 47 08/28/2013   AST 37 08/28/2013   ALT 24 08/28/2013      The ASCVD Risk score (Arnett DK, et al., 2019) failed to calculate for the following reasons:   The 2019 ASCVD risk score is only valid for ages 73 to 63  Assessment & Plan:  Strain of thoracic back region -     Ketorolac Tromethamine -     Carisoprodol; Take 1 tablet (350 mg total) by mouth 3 (three) times daily as needed for muscle spasms.  Dispense: 20 tablet; Refill: 0 -     Ketorolac Tromethamine; Take 1 tablet (10 mg total) by mouth every 6 (six) hours as needed for severe pain (pain score 7-10).  Dispense: 16 tablet;  Refill: 0  Acute Back Pain Sudden onset of severe back pain radiating to the arms and associated with tingling sensation. No history of chronic back issues. Physical examination suggests musculoskeletal origin. No red flag symptoms. -Administer Ketorolac 60mg  injection today. -Prescribe Ketorolac for oral use starting tomorrow, up to 4 times a day for a maximum of 4 days. -Prescribe Carisoprodol (Soma) TID PRN for muscle  relaxation. -Advise use of Epsom salt baths and moist heat for pain relief. -BP recheck WNL, suspect acute pain was the initial culprit   Return in about 2 months (around 04/27/2023) for Annual Physical.   Maretta Bees, PA

## 2023-04-28 ENCOUNTER — Ambulatory Visit: Payer: 59 | Admitting: Urgent Care

## 2023-04-28 ENCOUNTER — Encounter: Payer: Self-pay | Admitting: Urgent Care

## 2023-04-28 VITALS — BP 133/85 | Ht 69.0 in | Wt 199.4 lb

## 2023-04-28 DIAGNOSIS — Z23 Encounter for immunization: Secondary | ICD-10-CM | POA: Diagnosis not present

## 2023-04-28 DIAGNOSIS — F419 Anxiety disorder, unspecified: Secondary | ICD-10-CM | POA: Diagnosis not present

## 2023-04-28 DIAGNOSIS — Z0000 Encounter for general adult medical examination without abnormal findings: Secondary | ICD-10-CM | POA: Diagnosis not present

## 2023-04-28 DIAGNOSIS — Z1231 Encounter for screening mammogram for malignant neoplasm of breast: Secondary | ICD-10-CM

## 2023-04-28 DIAGNOSIS — G4700 Insomnia, unspecified: Secondary | ICD-10-CM

## 2023-04-28 DIAGNOSIS — Z1159 Encounter for screening for other viral diseases: Secondary | ICD-10-CM

## 2023-04-28 MED ORDER — BUSPIRONE HCL 10 MG PO TABS: ORAL_TABLET | ORAL | 2 refills | Status: DC

## 2023-04-28 MED ORDER — ALPRAZOLAM 0.5 MG PO TABS: 0.5000 mg | ORAL_TABLET | Freq: Two times a day (BID) | ORAL | 0 refills | Status: AC | PRN

## 2023-04-28 MED ORDER — ESCITALOPRAM OXALATE 10 MG PO TABS: 10.0000 mg | ORAL_TABLET | Freq: Every day | ORAL | 1 refills | Status: DC

## 2023-04-28 NOTE — Progress Notes (Signed)
 Annual Wellness Visit     Patient: Alexandria Vincent, Female    DOB: 1980-07-28, 43 y.o.   MRN: [id number]  Subjective  Chief Complaint  Patient presents with   Annual Exam    Pt is here today for CPE. Pt is not fasting.    Alexandria Vincent is a 43 y.o. female who presents today for her Annual Wellness Visit. She reports consuming a general diet. Home exercise routine includes running 3-4x/ week, 5-6 miles. She generally feels fairly well. She reports sleeping poorly. She does have additional problems to discuss today.   HPI  Discussed the use of AI scribe software for clinical note transcription with the patient, who gave verbal consent to proceed.  History of Present Illness   Alexandria Vincent is a 43 year old female who presents with concerns about her anxiety management.  She experiences significant anxiety, describing herself as 'emotionally everywhere' with random crying spells. Her anxiety has increased, leading to difficulty sleeping and requiring two Unisom tablets nightly for sleep, a pattern persisting for years. She is hesitant to take medication but acknowledges the impact of her anxiety on daily life, including feeling awkward in social interactions and overwhelmed by family responsibilities.  Her anxiety reportedly worsened after having children, and she recalls being very shy and prone to crying easily as a child. Running is helpful for managing her anxiety, but she feels guilty about the time it takes away from her children. She feels overwhelmed by the end of the school day and unable to concentrate on her children's conversations.  She has been on Lexapro, which she found helpful, but she has run out of the medication. She took the prescribed 90 tablets. Recent increased stress due to family issues, including her father's surgery and her son's flu, has exacerbated her anxiety symptoms.  She experiences panic attacks, describing episodes where she cannot breathe and  feels overwhelmed by racing thoughts. She has had to leave situations, such as a church service, due to panic attacks triggered by perceived threats.  She reports difficulty sleeping, frequent waking during the night, and vivid dreams. Her sleep is not restful despite taking Unisom. No current issues with irregular menstrual bleeding.  She is concerned about weight gain, attributing it to stress, anxiety, and sleep disturbances. She typically maintains a healthy diet and runs three to four times a week, covering five to six miles per session, which she finds therapeutic.  There is a family history of anxiety, with her father having severe anxiety, although undiagnosed.    Dental: No current dental problems and Last dental visit: one year   Patient Active Problem List   Diagnosis Date Noted   Encounter for initial prescription of contraceptive pills 06/25/2019   Encounter for gynecological examination with Papanicolaou smear of cervix 06/25/2019   Irregular bleeding 12/19/2014   Anxiety 11/26/2013   Past Medical History:  Diagnosis Date   Abnormal Pap smear 2011   asc hpv   Anxiety 11/26/2013   BV (bacterial vaginosis) 01/28/2014   Cervicitis 12/19/2014   IBS (irritable bowel syndrome)    Irregular bleeding 12/19/2014   IUD (intrauterine device) in place 01/16/2013   LLQ pain 12/19/2014   Mental disorder    anxiety   Migraine    "twice/month recently" (08/28/2013)   Sinus infection 05/18/2012   Skipped heart beats    Vaginal discharge 01/28/2014   Past Surgical History:  Procedure Laterality Date   APPENDECTOMY  08/25/2013   laparoscopic   LAPAROSCOPIC APPENDECTOMY  N/A 08/25/2013   Procedure: APPENDECTOMY LAPAROSCOPIC;  Surgeon: Atilano Ina, MD;  Location: [facility];  Service: General;  Laterality: N/A;   Social History   Tobacco Use   Smoking status: Never   Smokeless tobacco: Never  Vaping Use   Vaping status: Never Used  Substance Use Topics   Alcohol use: Yes     Comment: 08/28/2013 "glass of wine a couple times/month"   Drug use: No      Medications: Outpatient Medications Prior to Visit  Medication Sig   Azelastine-Fluticasone (DYMISTA) 137-50 MCG/ACT SUSP Place 2 sprays into the nose every 12 (twelve) hours.   benzonatate (TESSALON PERLES) 100 MG capsule Take 1 capsule (100 mg total) by mouth 3 (three) times daily as needed for cough. (Patient not taking: Reported on 04/28/2023)   carisoprodol (SOMA) 350 MG tablet Take 1 tablet (350 mg total) by mouth 3 (three) times daily as needed for muscle spasms. (Patient not taking: Reported on 04/28/2023)   montelukast (SINGULAIR) 10 MG tablet Take 1 tablet (10 mg total) by mouth at bedtime. (Patient not taking: Reported on 04/28/2023)   Norethindrone-Ethinyl Estradiol-Fe Biphas (LO LOESTRIN FE) 1 MG-10 MCG / 10 MCG tablet Take 1 tablet by mouth daily. Take 1 daily by mouth (Patient not taking: Reported on 12/14/2022)   [DISCONTINUED] ALPRAZolam (XANAX) 0.5 MG tablet Take by mouth. (Patient not taking: Reported on 12/14/2022)   [DISCONTINUED] escitalopram (LEXAPRO) 10 MG tablet Take 1 tablet (10 mg total) by mouth daily.   [DISCONTINUED] ketorolac (TORADOL) 10 MG tablet Take 1 tablet (10 mg total) by mouth every 6 (six) hours as needed for severe pain (pain score 7-10). (Patient not taking: Reported on 04/28/2023)   No facility-administered medications prior to visit.    Allergies  Allergen Reactions   Other Swelling    Walnuts cause angio edema    Patient Care Team: Maretta Bees, [place] as PCP - General (Physician Assistant)  ROS Complete 12 point ROS performed with all pertinent positives listed in HPI    Objective  BP 133/85   Ht 5\' 9"  (1.753 m)   Wt 199 lb 6.4 oz (90.4 kg)   SpO2 97%   BMI 29.45 kg/m  BP Readings from Last 3 Encounters:  04/28/23 133/85  02/24/23 124/85  12/14/22 126/87   Wt Readings from Last 3 Encounters:  04/28/23 199 lb 6.4 oz (90.4 kg)  02/24/23 196 lb (88.9 kg)   12/14/22 198 lb (89.8 kg)      Physical Exam Vitals and nursing note reviewed.  Constitutional:      General: She is not in acute distress.    Appearance: Normal appearance. She is not ill-appearing, toxic-appearing or diaphoretic.  HENT:     Head: Normocephalic and atraumatic.     Right Ear: Tympanic membrane, ear canal and external ear normal. There is no impacted cerumen.     Left Ear: Tympanic membrane, ear canal and external ear normal. There is no impacted cerumen.     Nose: Nose normal.     Mouth/Throat:     Mouth: Mucous membranes are moist.     Pharynx: Oropharynx is clear. No oropharyngeal exudate or posterior oropharyngeal erythema.  Eyes:     General: No scleral icterus.       Right eye: No discharge.        Left eye: No discharge.     Extraocular Movements: Extraocular movements intact.     Pupils: Pupils are equal, round, and reactive to light.  Neck:     Thyroid: No thyroid mass, thyromegaly or thyroid tenderness.  Cardiovascular:     Rate and Rhythm: Normal rate and regular rhythm.     Pulses: Normal pulses.     Heart sounds: No murmur heard. Pulmonary:     Effort: Pulmonary effort is normal. No respiratory distress.     Breath sounds: Normal breath sounds. No stridor. No wheezing or rhonchi.  Abdominal:     General: Abdomen is flat. Bowel sounds are normal. There is no distension.     Palpations: Abdomen is soft. There is no mass.     Tenderness: There is no abdominal tenderness. There is no guarding.  Musculoskeletal:     Cervical back: Normal range of motion and neck supple. No rigidity or tenderness.     Right lower leg: No edema.     Left lower leg: No edema.  Lymphadenopathy:     Cervical: No cervical adenopathy.  Skin:    General: Skin is warm and dry.     Coloration: Skin is not jaundiced.     Findings: No bruising, erythema or rash.  Neurological:     General: No focal deficit present.     Mental Status: She is alert and oriented to person,  place, and time.     Sensory: No sensory deficit.     Motor: No weakness.  Psychiatric:        Mood and Affect: Mood normal.        Behavior: Behavior normal.       Most recent functional status assessment:     No data to display         Most recent fall risk assessment:    04/28/2023    3:44 PM  Fall Risk   Falls in the past year? 1  Number falls in past yr: 0  Injury with Fall? 1  Risk for fall due to : History of fall(s)  Follow up Falls evaluation completed    Most recent depression screenings:    04/28/2023    3:45 PM 12/14/2022    2:54 PM  PHQ 2/9 Scores  PHQ - 2 Score 3 1  PHQ- 9 Score 10 12   Most recent cognitive screening:     No data to display         Most recent Audit-C alcohol use screening    06/25/2019    3:37 PM  Alcohol Use Disorder Test (AUDIT)  1. How often do you have a drink containing alcohol? 3  2. How many drinks containing alcohol do you have on a typical day when you are drinking? 1  3. How often do you have six or more drinks on one occasion? 1  AUDIT-C Score 5   A score of 3 or more in women, and 4 or more in men indicates increased risk for alcohol abuse, EXCEPT if all of the points are from question 1   Vision/Hearing Screen: No results found.  Last CBC Lab Results  Component Value Date   WBC 6.6 08/29/2013   HGB 9.6 (L) 08/29/2013   HCT 30.0 (L) 08/29/2013   MCV 88.2 08/29/2013   MCH 28.2 08/29/2013   RDW 12.1 08/29/2013   PLT 262 08/29/2013   Last metabolic panel Lab Results  Component Value Date   GLUCOSE 89 08/28/2013   NA 143 08/28/2013   K 4.0 08/28/2013   CL 105 08/28/2013   CO2 25 08/28/2013   BUN 11 08/28/2013   CREATININE  0.88 08/28/2013   GFRNONAA 87 (L) 08/28/2013   CALCIUM 9.1 08/28/2013   PROT 6.9 08/28/2013   ALBUMIN 3.5 08/28/2013   BILITOT 0.2 (L) 08/28/2013   ALKPHOS 47 08/28/2013   AST 37 08/28/2013   ALT 24 08/28/2013   Last lipids No results found for: "CHOL", "HDL", "LDLCALC",  "LDLDIRECT", "TRIG", "CHOLHDL" Last hemoglobin A1c No results found for: "HGBA1C"    No results found for any visits on 04/28/23.    Assessment & Plan   Annual wellness visit done today including the all of the following: Reviewed patient's Family Medical History Reviewed and updated list of patient's medical providers Assessment of cognitive impairment was done Assessed patient's functional ability Established a written schedule for health screening services Health Risk Assessent Completed and Reviewed  Exercise Activities and Dietary recommendations  Goals   Decrease weight down to 160#     Immunization History  Administered Date(s) Administered   Influenza Whole 03/22/2012   Influenza,inj,Quad PF,6+ Mos 03/21/2017   PFIZER(Purple Top)SARS-COV-2 Vaccination 05/24/2019, 06/16/2019   Tdap 09/29/2012, 04/28/2023    Health Maintenance  Topic Date Due   Hepatitis C Screening  Never done   INFLUENZA VACCINE  05/30/2023 (Originally 09/30/2022)   COVID-19 Vaccine (3 - 2024-25 season) 02/14/2025 (Originally 10/31/2022)   Cervical Cancer Screening (HPV/Pap Cotest)  06/24/2024   DTaP/Tdap/Td (3 - Td or Tdap) 04/27/2033   HIV Screening  Completed   HPV VACCINES  Aged Out     Discussed health benefits of physical activity, and encouraged her to engage in regular exercise appropriate for her age and condition.    Problem List Items Addressed This Visit       Other   Anxiety   Relevant Medications   escitalopram (LEXAPRO) 10 MG tablet   busPIRone (BUSPAR) 10 MG tablet   ALPRAZolam (XANAX) 0.5 MG tablet   Other Visit Diagnoses       Adult wellness visit    -  Primary   Relevant Orders   CBC   Comprehensive metabolic panel   Hemoglobin A1c   Lipid panel   TSH     Need for diphtheria-tetanus-pertussis (Tdap) vaccine         Encounter for hepatitis C screening test for low risk patient       Relevant Orders   Hepatitis C Antibody     Insomnia, unspecified type        Relevant Medications   ALPRAZolam (XANAX) 0.5 MG tablet     Screening mammogram for breast cancer       Relevant Orders   MM 3D SCREENING MAMMOGRAM BILATERAL BREAST      Assessment and Plan    Anxiety and Depression Chronic anxiety with recent exacerbation due to life stressors. Patient reports emotional instability, crying spells, and difficulty sleeping. Previously on Lexapro with some benefit. Family history of anxiety. -Resume Lexapro. -Start Buspirone with a gradual dose increase over a week and a half to reach 10mg  twice daily. -Provide Xanax for acute anxiety episodes, not to be used daily due to risk of addiction. -Follow up in one month to assess efficacy of new medication regimen.  Insomnia Chronic difficulty maintaining sleep, currently using Unisom. -Add melatonin 10mg  nightly. -Assess impact of new anxiety medications on sleep at follow-up visit. -may use one tab of xanax very sporadically for a bad night of insomnia  General Health Maintenance -Order mammogram due to family history of breast cancer, to be completed over the summer. -Update Tetanus vaccination today. -Perform  Hepatitis C screening today. -Continue regular exercise regimen of running 3-4 times per week, 5-6 miles each time.        Return in about 4 weeks (around 05/26/2023).     Maretta Bees, PA

## 2023-04-28 NOTE — Patient Instructions (Addendum)
 We completed your annual physical exam.  Please update your mammogram - you can wait to complete during the summer if easier for scheduling purposes. Your tetanus vaccine was updated.  Please re-start your lexapro. Take 10mg  once daily.  We will also add buspirone to help with your anxiety. Please take per the following taper schedule: Start buspirone taper as discussed: Day 1-4: Take 1/2 tab every morning Day 5-8: Take 1/2 tab every morning and every evening Day 9-12: Take 1 tab in the morning and 1/2 tab every evening Day 13+: Take 1 full tab twice daily  I have called in a few tablets of xanax. These medications are for AS NEEDED use only and should be used sparingly, only for panic attacks. These medications can cause severe drowsiness so do not take prior to driving a car. You can use them intermittently for sleep if needed.   Start taking 10mg  melatonin nightly to help with sleep. Return for recheck in one month to assess the efficacy of the medications prescribed today.

## 2023-04-29 ENCOUNTER — Encounter: Payer: Self-pay | Admitting: Urgent Care

## 2023-04-29 LAB — COMPREHENSIVE METABOLIC PANEL
ALT: 12 U/L (ref 0–35)
AST: 17 U/L (ref 0–37)
Albumin: 4.6 g/dL (ref 3.5–5.2)
Alkaline Phosphatase: 43 U/L (ref 39–117)
BUN: 24 mg/dL — ABNORMAL HIGH (ref 6–23)
CO2: 26 meq/L (ref 19–32)
Calcium: 9.7 mg/dL (ref 8.4–10.5)
Chloride: 106 meq/L (ref 96–112)
Creatinine, Ser: 0.97 mg/dL (ref 0.40–1.20)
GFR: 73.27 mL/min (ref >60.00)
Glucose, Bld: 96 mg/dL (ref 70–99)
Potassium: 4.5 meq/L (ref 3.5–5.1)
Sodium: 140 meq/L (ref 135–145)
Total Bilirubin: 0.4 mg/dL (ref 0.2–1.2)
Total Protein: 7.5 g/dL (ref 6.0–8.3)

## 2023-04-29 LAB — CBC
HCT: 42.3 % (ref 36.0–46.0)
Hemoglobin: 13.7 g/dL (ref 12.0–15.0)
MCHC: 32.3 g/dL (ref 30.0–36.0)
MCV: 94.6 fl (ref 78.0–100.0)
Platelets: 286 10*3/uL (ref 150.0–400.0)
RBC: 4.47 Mil/uL (ref 3.87–5.11)
RDW: 13 % (ref 11.5–15.5)
WBC: 8.3 10*3/uL (ref 4.0–10.5)

## 2023-04-29 LAB — LIPID PANEL
Cholesterol: 193 mg/dL (ref 0–200)
HDL: 74.5 mg/dL (ref >39.00)
LDL Cholesterol: 101 mg/dL — ABNORMAL HIGH (ref 0–99)
NonHDL: 118.7
Total CHOL/HDL Ratio: 3
Triglycerides: 87 mg/dL (ref 0.0–149.0)
VLDL: 17.4 mg/dL (ref 0.0–40.0)

## 2023-04-29 LAB — TSH: TSH: 1.86 u[IU]/mL (ref 0.35–5.50)

## 2023-04-29 LAB — HEMOGLOBIN A1C: Hgb A1c MFr Bld: 5.2 % (ref 4.6–6.5)

## 2023-04-29 LAB — HEPATITIS C ANTIBODY: Hepatitis C Ab: NONREACTIVE

## 2023-05-09 ENCOUNTER — Other Ambulatory Visit: Payer: Self-pay

## 2023-05-09 DIAGNOSIS — F419 Anxiety disorder, unspecified: Secondary | ICD-10-CM

## 2023-05-09 MED ORDER — BUSPIRONE HCL 10 MG PO TABS: ORAL_TABLET | ORAL | 2 refills | Status: DC

## 2023-05-26 ENCOUNTER — Ambulatory Visit: Payer: 59 | Admitting: Urgent Care

## 2023-05-26 VITALS — BP 132/85 | HR 71 | Wt 200.0 lb

## 2023-05-26 DIAGNOSIS — S99911A Unspecified injury of right ankle, initial encounter: Secondary | ICD-10-CM | POA: Diagnosis not present

## 2023-05-26 DIAGNOSIS — F413 Other mixed anxiety disorders: Secondary | ICD-10-CM | POA: Diagnosis not present

## 2023-05-26 DIAGNOSIS — F419 Anxiety disorder, unspecified: Secondary | ICD-10-CM

## 2023-05-26 MED ORDER — ETODOLAC 500 MG PO TABS: 500.0000 mg | ORAL_TABLET | Freq: Two times a day (BID) | ORAL | 0 refills | Status: DC

## 2023-05-26 MED ORDER — BUSPIRONE HCL 15 MG PO TABS: 15.0000 mg | ORAL_TABLET | Freq: Two times a day (BID) | ORAL | 1 refills | Status: DC

## 2023-05-26 NOTE — Progress Notes (Unsigned)
 Established Patient Office Visit  Subjective:  Patient ID: Alexandria Vincent, female    DOB: 20-Jan-1981  Age: 43 y.o. MRN: [id number]  Chief Complaint  Patient presents with   Follow-up    4 week follow. She wants to see if her dose can be increased. She also hurt her left foot while running and wants to see what she can use    HPI  Discussed the use of AI scribe software for clinical note transcription with the patient, who gave verbal consent to proceed.  History of Present Illness   Alexandria Vincent is a 43 year old female with anxiety and depression who presents with ongoing anxiety symptoms and foot pain.  She experiences persistent anxiety symptoms despite treatment with Lexapro and Buspirone. Lexapro has improved her energy levels, but significant anxiety persists, particularly related to stress at work and home. Buspirone has helped her manage work stress and enjoy family time more, yet she continues to worry excessively about school-related issues and feels overwhelmed by noise at home. She has difficulty sleeping, waking up at 4 AM and being unable to return to sleep, and continues to take [facility] for sleep. Occasionally, she takes Xanax for acute anxiety episodes, but has only required two pills in the past one month. Overall, she feels significant improvement but would like a slight dose adjustment.  She reports right foot pain that began after a 6 mile run on Monday, describing it as a bruised feeling on the side of her foot, which worsened the day after her run. Icing and ibuprofen have provided some relief, but the pain still affects her ability to walk. No specific injury occurred during the run, and she suspects her footwear may be contributing. She has a history of surgery on the other foot and is frequently on her feet, which may exacerbate the pain.  She describes increased stress due to end-of-year activities at school and her husband's new job, which requires late hours,  leaving her to manage household responsibilities alone. She has two children, one in seventh grade and one in fifth grade, and mentions that her husband recently bought a guitar for one of them. She is planning a family trip to [place] during spring break and uses Dramamine for motion sickness.      Patient Active Problem List   Diagnosis Date Noted   Encounter for initial prescription of contraceptive pills 06/25/2019   Encounter for gynecological examination with Papanicolaou smear of cervix 06/25/2019   Irregular bleeding 12/19/2014   Anxiety 11/26/2013   Past Medical History:  Diagnosis Date   Abnormal Pap smear 2011   asc hpv   Anxiety 11/26/2013   BV (bacterial vaginosis) 01/28/2014   Cervicitis 12/19/2014   IBS (irritable bowel syndrome)    Irregular bleeding 12/19/2014   IUD (intrauterine device) in place 01/16/2013   LLQ pain 12/19/2014   Mental disorder    anxiety   Migraine    "twice/month recently" (08/28/2013)   Sinus infection 05/18/2012   Skipped heart beats    Vaginal discharge 01/28/2014   Past Surgical History:  Procedure Laterality Date   APPENDECTOMY  08/25/2013   laparoscopic   LAPAROSCOPIC APPENDECTOMY N/A 08/25/2013   Procedure: APPENDECTOMY LAPAROSCOPIC;  Surgeon: Atilano Ina, MD;  Location: [facility] OR;  Service: General;  Laterality: N/A;   Social History   Tobacco Use   Smoking status: Never   Smokeless tobacco: Never  Vaping Use   Vaping status: Never Used  Substance Use Topics  Alcohol use: Yes    Comment: 08/28/2013 "glass of wine a couple times/month"   Drug use: No      ROS: as noted in HPI  Objective:     BP 132/85   Pulse 71   Wt 200 lb (90.7 kg)   SpO2 96%   BMI 29.53 kg/m  BP Readings from Last 3 Encounters:  05/26/23 132/85  04/28/23 133/85  02/24/23 124/85   Wt Readings from Last 3 Encounters:  05/26/23 200 lb (90.7 kg)  04/28/23 199 lb 6.4 oz (90.4 kg)  02/24/23 196 lb (88.9 kg)      Physical Exam Vitals and  nursing note reviewed.  Constitutional:      General: She is not in acute distress.    Appearance: Normal appearance. She is not ill-appearing, toxic-appearing or diaphoretic.  HENT:     Head: Normocephalic and atraumatic.  Eyes:     General: No scleral icterus.       Right eye: No discharge.        Left eye: No discharge.     Extraocular Movements: Extraocular movements intact.     Pupils: Pupils are equal, round, and reactive to light.  Cardiovascular:     Rate and Rhythm: Normal rate.  Pulmonary:     Effort: Pulmonary effort is normal. No respiratory distress.  Musculoskeletal:     Right lower leg: No tenderness or bony tenderness. No edema.     Left lower leg: No edema.     Right ankle: Tenderness present over the CF ligament. No lateral malleolus, medial malleolus or ATF ligament tenderness. Anterior drawer test negative.     Right Achilles Tendon: Normal. No tenderness. Thompson's test negative.     Right foot: Normal. No swelling.       Feet:  Feet:     Right foot:     Skin integrity: Skin integrity normal. No erythema, warmth or dry skin.     Toenail Condition: Right toenails are normal.     Left foot:     Skin integrity: Skin integrity normal. No erythema or warmth.     Toenail Condition: Left toenails are normal.     Comments: Talar tilt test negative Skin:    General: Skin is warm and dry.     Coloration: Skin is not jaundiced.     Findings: No bruising, erythema or rash.  Neurological:     General: No focal deficit present.     Mental Status: She is alert and oriented to person, place, and time.     Gait: Gait normal.  Psychiatric:        Mood and Affect: Mood normal.        Behavior: Behavior normal.      No results found for any visits on 05/26/23.  Last CBC Lab Results  Component Value Date   WBC 8.3 04/28/2023   HGB 13.7 04/28/2023   HCT 42.3 04/28/2023   MCV 94.6 04/28/2023   MCH 28.2 08/29/2013   RDW 13.0 04/28/2023   PLT 286.0 04/28/2023    Last metabolic panel Lab Results  Component Value Date   GLUCOSE 96 04/28/2023   NA 140 04/28/2023   K 4.5 04/28/2023   CL 106 04/28/2023   CO2 26 04/28/2023   BUN 24 (H) 04/28/2023   CREATININE 0.97 04/28/2023   GFR 73.27 04/28/2023   CALCIUM 9.7 04/28/2023   PROT 7.5 04/28/2023   ALBUMIN 4.6 04/28/2023   BILITOT 0.4 04/28/2023   ALKPHOS 43  04/28/2023   AST 17 04/28/2023   ALT 12 04/28/2023      The 10-year ASCVD risk score (Arnett DK, et al., 2019) is: 0.3%  Assessment & Plan:  Anxiety -     busPIRone HCl; Take 1 tablet (15 mg total) by mouth 2 (two) times daily.  Dispense: 180 tablet; Refill: 1  Other mixed anxiety disorders  Injury of right ankle, initial encounter -     Etodolac; Take 1 tablet (500 mg total) by mouth 2 (two) times daily with a meal.  Dispense: 28 tablet; Refill: 0   Assessment and Plan    Assessment and Plan    Right Ankle Pain Right ankle pain consistent with calcaneofibular sprain or peroneal tendonitis given location, likely due to overuse. No fracture suspected. - Prescribed stronger anti-inflammatory medication twice daily with food for 14 days. - Advised use of compression sleeve for support. - Recommended rest, activity guided by pain level, avoid running six miles until improvement. - Reassess if no improvement after one week.  Anxiety Ongoing anxiety related to work and home stressors. Current Lexapro and Buspirone regimen partially effective. No side effects from Buspirone. - Increase Buspirone dosage. Take one and a half tablets until current supply is finished, then fill higher dose prescription. (15mg  BID) - Monitor symptoms and adjust medication as needed.              Return in about 11 months (around 04/30/2024) for Annual Physical.   Maretta Bees, PA

## 2023-05-26 NOTE — Patient Instructions (Addendum)
 I suspect you have a either a developing peroneal tendonitis or a partial calcaneofibular ligamentous sprain based upon the location of your ankle pain.  Please take the anti-inflammatory medication called in today twice daily with food. Do not take any additional OTC NSAIDS (advil, motrin, ibuprofen, aleve, naproxen).   Ice the ankle 2-3x/ day. REST - no excessive running or working out for one week.  Continue your lexapro 10mg . Increase your buspar to 15mg  twice daily (take 1.5 tabs of your 10mg  until gone, then fill the 15mg  tab).

## 2023-05-27 ENCOUNTER — Encounter: Payer: Self-pay | Admitting: Urgent Care

## 2023-07-05 ENCOUNTER — Ambulatory Visit: Payer: Self-pay

## 2023-07-05 NOTE — Telephone Encounter (Signed)
  Chief Complaint: back and neck pain Symptoms: upper back pain that goes into her neck Frequency: started about a week ago Pertinent Negatives: Patient denies CP, SOB, urinary symptoms, numbness, weakness Disposition: [] ED /[] Urgent Care (no appt availability in office) / [x] Appointment(In office/virtual)/ []  [facility]/ [] Home Care/ [] Refused Recommended Disposition /[] [facility]/ []  Follow-up with PCP Additional Notes: patient called with concerns for back and neck pain that has been going on for a week. Patient states back pain is upper back that goes into her neck. Patient states base of her neck seems inflamed and simple tasks like picking up her purse are painful. Patient denies CP, SOB, urinary symptoms, numbness or weakness. Patient does endorse a lot of stress currently including death of grandmother last night. Per protocol, patient is recommended to be seen within 3 days. Appointment made for 07/06/2023 at 8:40 AM with another provider in PCP office as patient's provider doesn't have availability that works for patient. Patient verbalized understanding of plan and all questions answered.    Copied from CRM [id number]. Topic: Clinical - Red Word Triage >> Jul 05, 2023  8:31 AM Martinique E wrote: [facility] that prompted transfer to Nurse Triage: Severe back pain. Patient stated she has been noticing her back pain flare up over the past 2 weeks, where the base of her neck is now inflamed and it is painful to do simple tasks like pick up her purse. Reason for Disposition  [1] MODERATE back pain (e.g., interferes with normal activities) AND [2] present > 3 days  Answer Assessment - Initial Assessment Questions 1. ONSET: "When did the pain begin?"      Started about a week ago 2. LOCATION: "Where does it hurt?" (upper, mid or lower back)     Upper back 3. SEVERITY: "How bad is the pain?"  (e.g., Scale 1-10; mild, moderate, or severe)   - MILD (1-3): Doesn't interfere  with normal activities.    - MODERATE (4-7): Interferes with normal activities or awakens from sleep.    - SEVERE (8-10): Excruciating pain, unable to do any normal activities.      5 out of 10 4. PATTERN: "Is the pain constant?" (e.g., yes, no; constant, intermittent)      constant 5. RADIATION: "Does the pain shoot into your legs or somewhere else?"     no 6. CAUSE:  "What do you think is causing the back pain?"      unsure 7. BACK OVERUSE:  "Any recent lifting of heavy objects, strenuous work or exercise?"     no 8. MEDICINES: "What have you taken so far for the pain?" (e.g., nothing, acetaminophen , NSAIDS)     Ibuprofen  9. NEUROLOGIC SYMPTOMS: "Do you have any weakness, numbness, or problems with bowel/bladder control?"     no 10. OTHER SYMPTOMS: "Do you have any other symptoms?" (e.g., fever, abdomen pain, burning with urination, blood in urine)       No 11. PREGNANCY: "Is there any chance you are pregnant?" "When was your last menstrual period?"       no  Protocols used: Back Pain-A-AH

## 2023-07-05 NOTE — Telephone Encounter (Signed)
 Left pt a vm and sent a mychart to see if she wanted to come today at 1:40

## 2023-07-06 ENCOUNTER — Ambulatory Visit: Admitting: Family Medicine

## 2023-07-06 NOTE — Progress Notes (Deleted)
 OFFICE VISIT  07/06/2023  CC: No chief complaint on file.   Patient is a 43 y.o. female who presents for back pain.  HPI: ***  Past Medical History:  Diagnosis Date   Abnormal Pap smear 2011   asc hpv   Anxiety 11/26/2013   BV (bacterial vaginosis) 01/28/2014   Cervicitis 12/19/2014   IBS (irritable bowel syndrome)    Irregular bleeding 12/19/2014   IUD (intrauterine device) in place 01/16/2013   LLQ pain 12/19/2014   Mental disorder    anxiety   Migraine    "twice/month recently" (08/28/2013)   Sinus infection 05/18/2012   Skipped heart beats    Vaginal discharge 01/28/2014    Past Surgical History:  Procedure Laterality Date   APPENDECTOMY  08/25/2013   laparoscopic   LAPAROSCOPIC APPENDECTOMY N/A 08/25/2013   Procedure: APPENDECTOMY LAPAROSCOPIC;  Surgeon: Fran Imus, MD;  Location: [facility] OR;  Service: General;  Laterality: N/A;    Outpatient Medications Prior to Visit  Medication Sig Dispense Refill   ALPRAZolam  (XANAX ) 0.5 MG tablet Take 1 tablet (0.5 mg total) by mouth 2 (two) times daily as needed for anxiety or sleep. 20 tablet 0   Azelastine -Fluticasone  (DYMISTA ) 137-50 MCG/ACT SUSP Place 2 sprays into the nose every 12 (twelve) hours. 23 g 0   benzonatate  (TESSALON  PERLES) 100 MG capsule Take 1 capsule (100 mg total) by mouth 3 (three) times daily as needed for cough. (Patient not taking: Reported on 02/24/2023) 20 capsule 0   busPIRone  (BUSPAR ) 15 MG tablet Take 1 tablet (15 mg total) by mouth 2 (two) times daily. 180 tablet 1   carisoprodol  (SOMA ) 350 MG tablet Take 1 tablet (350 mg total) by mouth 3 (three) times daily as needed for muscle spasms. (Patient not taking: Reported on 05/26/2023) 20 tablet 0   escitalopram  (LEXAPRO ) 10 MG tablet Take 1 tablet (10 mg total) by mouth daily. 90 tablet 1   etodolac  (LODINE ) 500 MG tablet Take 1 tablet (500 mg total) by mouth 2 (two) times daily with a meal. 28 tablet 0   montelukast  (SINGULAIR ) 10 MG tablet Take 1 tablet  (10 mg total) by mouth at bedtime. 30 tablet 0   Norethindrone-Ethinyl Estradiol-Fe Biphas (LO LOESTRIN FE ) 1 MG-10 MCG / 10 MCG tablet Take 1 tablet by mouth daily. Take 1 daily by mouth (Patient not taking: Reported on 05/26/2023) 4 Package 0   No facility-administered medications prior to visit.    Allergies  Allergen Reactions   Other Swelling    Walnuts cause angio edema    Review of Systems  As per HPI  PE:    05/26/2023    4:19 PM 04/28/2023    3:41 PM 02/24/2023    1:37 PM  Vitals with BMI  Height  5\' 9"    Weight 200 lbs 199 lbs 6 oz   BMI 29.52 29.43   Systolic 132 133 161  Diastolic 85 85 85  Pulse 71       Physical Exam  ***  LABS:  {Labs (Optional):23779}  IMPRESSION AND PLAN:  No problem-specific Assessment & Plan notes found for this encounter.   An After Visit Summary was printed and given to the patient.  FOLLOW UP: No follow-ups on file.  @esig @

## 2023-08-09 ENCOUNTER — Ambulatory Visit: Admitting: Urgent Care

## 2023-08-09 ENCOUNTER — Encounter: Payer: Self-pay | Admitting: Urgent Care

## 2023-08-09 VITALS — BP 138/81 | HR 71 | Wt 201.0 lb

## 2023-08-09 DIAGNOSIS — M542 Cervicalgia: Secondary | ICD-10-CM | POA: Diagnosis not present

## 2023-08-09 DIAGNOSIS — F413 Other mixed anxiety disorders: Secondary | ICD-10-CM | POA: Diagnosis not present

## 2023-08-09 DIAGNOSIS — S29012A Strain of muscle and tendon of back wall of thorax, initial encounter: Secondary | ICD-10-CM | POA: Diagnosis not present

## 2023-08-09 MED ORDER — DESVENLAFAXINE SUCCINATE ER 25 MG PO TB24: 25.0000 mg | ORAL_TABLET | Freq: Every day | ORAL | 0 refills | Status: DC

## 2023-08-09 MED ORDER — KETOROLAC TROMETHAMINE 10 MG PO TABS: 10.0000 mg | ORAL_TABLET | Freq: Three times a day (TID) | ORAL | 0 refills | Status: DC | PRN

## 2023-08-09 MED ORDER — KETOROLAC TROMETHAMINE 30 MG/ML IJ SOLN: 30.0000 mg | Freq: Once | INTRAMUSCULAR | Status: DC

## 2023-08-09 MED ORDER — KETOROLAC TROMETHAMINE 60 MG/2ML IM SOLN
60.0000 mg | Freq: Once | INTRAMUSCULAR | Status: AC
  Administered 2023-08-09: 30 mg via INTRAMUSCULAR

## 2023-08-09 MED ORDER — CARISOPRODOL 350 MG PO TABS: 350.0000 mg | ORAL_TABLET | Freq: Three times a day (TID) | ORAL | 0 refills | Status: DC | PRN

## 2023-08-09 NOTE — Patient Instructions (Signed)
 You were given an injection of toradol . Start taking the oral ketorolac  tomorrow - every 6-8 hours as needed with food but do not exceed 4 days use. Take carisoprodol  up to every 8 hours as needed, keep in mind it may make you sleepy/ drowsy so best to take at night.  Please go to [facility] to obtain a neck xray: Address: [address] [place], [place] [address] Phone: [phone]   Please start pristique. Take daily. Follow up in 30 days to determine efficacy: [facility] [address], [place], [place] [address] Phone: [phone]

## 2023-08-09 NOTE — Progress Notes (Signed)
 Established Patient Office Visit  Subjective:  Patient ID: Alexandria Vincent, female    DOB: Sep 23, 1980  Age: 43 y.o. MRN: [id number]  Chief Complaint  Patient presents with   Back Pain    Pt has been having back and shoulder pain on and off for about 2 months now. She also wants to discuss possibly changing her anxiety meds and states its no longer working.    Back Pain    Discussed the use of AI scribe software for clinical note transcription with the patient, who gave verbal consent to proceed.  History of Present Illness   Alexandria Vincent is a 43 year old female who presents with persistent back pain.  She has been experiencing persistent back pain since spring break, approximately two months ago. The pain is located between two vertebrae at the base of her neck and is described as mostly dull, with occasional sharp shooting sensations that radiate through her shoulders and up into her head. The pain is better in the mornings but worsens by the afternoon, often requiring the use of ice or massage for relief.  The pain is exacerbated by activities such as picking up heavy objects like a Tide bottle or a laundry basket, causing it to radiate through her shoulder blades. She maintains full range of motion and can move her arms without issue, but activities like using an elliptical machine aggravate the pain. Running seems to alleviate the discomfort. She has been using ibuprofen  to manage the pain, but it has not provided consistent relief. During Christmas, the pain was severe enough to limit her mobility, but it improved with treatment at that time.  She has also been experiencing issues with her antidepressant medication. She was on Lexapro  10 mg but stopped it two weeks ago due to feeling more depressed and unmotivated. She has tried other medications in the past but often returns to Lexapro . She is concerned about her mental health, especially with the upcoming summer break, as she  fears losing her routine and becoming more depressed.  No muscle pain around the affected area and no issues with her arms' range of motion. Her sleep has improved recently, although she previously experienced disrupted sleep due to the pain.      Patient Active Problem List   Diagnosis Date Noted   Encounter for initial prescription of contraceptive pills 06/25/2019   Encounter for gynecological examination with Papanicolaou smear of cervix 06/25/2019   Irregular bleeding 12/19/2014   Anxiety 11/26/2013   Past Medical History:  Diagnosis Date   Abnormal Pap smear 2011   asc hpv   Anxiety 11/26/2013   BV (bacterial vaginosis) 01/28/2014   Cervicitis 12/19/2014   IBS (irritable bowel syndrome)    Irregular bleeding 12/19/2014   IUD (intrauterine device) in place 01/16/2013   LLQ pain 12/19/2014   Mental disorder    anxiety   Migraine    "twice/month recently" (08/28/2013)   Sinus infection 05/18/2012   Skipped heart beats    Vaginal discharge 01/28/2014   Past Surgical History:  Procedure Laterality Date   APPENDECTOMY  08/25/2013   laparoscopic   LAPAROSCOPIC APPENDECTOMY N/A 08/25/2013   Procedure: APPENDECTOMY LAPAROSCOPIC;  Surgeon: Fran Imus, MD;  Location: [facility] OR;  Service: General;  Laterality: N/A;   Social History   Tobacco Use   Smoking status: Never   Smokeless tobacco: Never  Vaping Use   Vaping status: Never Used  Substance Use Topics   Alcohol use: Yes  Comment: 08/28/2013 "glass of wine a couple times/month"   Drug use: No      ROS: as noted in HPI  Objective:     BP 138/81   Pulse 71   Wt 201 lb (91.2 kg)   SpO2 96%   BMI 29.68 kg/m  BP Readings from Last 3 Encounters:  08/09/23 138/81  05/26/23 132/85  04/28/23 133/85   Wt Readings from Last 3 Encounters:  08/09/23 201 lb (91.2 kg)  05/26/23 200 lb (90.7 kg)  04/28/23 199 lb 6.4 oz (90.4 kg)      Physical Exam Vitals and nursing note reviewed.  Constitutional:      General:  She is not in acute distress.    Appearance: Normal appearance. She is not ill-appearing, toxic-appearing or diaphoretic.  HENT:     Head: Normocephalic and atraumatic.  Eyes:     General: No scleral icterus.       Right eye: No discharge.        Left eye: No discharge.     Extraocular Movements: Extraocular movements intact.     Pupils: Pupils are equal, round, and reactive to light.  Neck:   Cardiovascular:     Rate and Rhythm: Normal rate and regular rhythm.  Pulmonary:     Effort: Pulmonary effort is normal. No respiratory distress.  Musculoskeletal:        General: No swelling or deformity. Normal range of motion.     Cervical back: Normal range of motion and neck supple. Tenderness present. No rigidity. Normal range of motion.  Lymphadenopathy:     Cervical: No cervical adenopathy.  Skin:    General: Skin is warm and dry.     Coloration: Skin is not jaundiced.     Findings: No bruising, erythema or rash.  Neurological:     General: No focal deficit present.     Mental Status: She is alert and oriented to person, place, and time.     Gait: Gait normal.  Psychiatric:        Mood and Affect: Mood normal.        Behavior: Behavior normal.      No results found for any visits on 08/09/23.  Last CBC Lab Results  Component Value Date   WBC 8.3 04/28/2023   HGB 13.7 04/28/2023   HCT 42.3 04/28/2023   MCV 94.6 04/28/2023   MCH 28.2 08/29/2013   RDW 13.0 04/28/2023   PLT 286.0 04/28/2023   Last metabolic panel Lab Results  Component Value Date   GLUCOSE 96 04/28/2023   NA 140 04/28/2023   K 4.5 04/28/2023   CL 106 04/28/2023   CO2 26 04/28/2023   BUN 24 (H) 04/28/2023   CREATININE 0.97 04/28/2023   GFR 73.27 04/28/2023   CALCIUM 9.7 04/28/2023   PROT 7.5 04/28/2023   ALBUMIN 4.6 04/28/2023   BILITOT 0.4 04/28/2023   ALKPHOS 43 04/28/2023   AST 17 04/28/2023   ALT 12 04/28/2023      The 10-year ASCVD risk score (Arnett DK, et al., 2019) is:  0.3%  Assessment & Plan:  Cervical spine pain -     DG Cervical Spine Complete; Future -     Ketorolac  Tromethamine ; Take 1 tablet (10 mg total) by mouth every 8 (eight) hours as needed.  Dispense: 20 tablet; Refill: 0 -     Carisoprodol ; Take 1 tablet (350 mg total) by mouth 3 (three) times daily as needed for muscle spasms.  Dispense: 20 tablet;  Refill: 0 -     Ketorolac  Tromethamine   Strain of thoracic back region -     Ketorolac  Tromethamine ; Take 1 tablet (10 mg total) by mouth every 8 (eight) hours as needed.  Dispense: 20 tablet; Refill: 0 -     Carisoprodol ; Take 1 tablet (350 mg total) by mouth 3 (three) times daily as needed for muscle spasms.  Dispense: 20 tablet; Refill: 0 -     Ketorolac  Tromethamine   Other mixed anxiety disorders -     Desvenlafaxine Succinate ER; Take 1 tablet (25 mg total) by mouth daily.  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 Differential includes spinal misalignment, loss of lordosis, or herniated disc. Tumor not suspected. - Order cervical spine x-ray to assess alignment and rule out structural abnormalities. - Administer muscle relaxer and prescription anti-inflammatory for discomfort. - Administer injection for immediate pain relief. - Prescribe oral medication for ongoing pain management. - Follow up with x-ray results to determine further management.  Depression Increased depression and lack of motivation despite Lexapro . Suggested switch to SNRI, Pristiq, for better mood stabilization and less weight gain risk. - Prescribe Pristiq 25 mg daily for depression. - Monitor for adverse reactions and report if she occurs. - Follow up in 30 days to assess Pristiq's efficacy and side effects.  Follow-up Plans to complete x-ray at [facility], ordered stat for expedited results. - Complete x-ray at [facility] as convenient. -  Schedule follow-up to discuss x-ray results and adjust treatment plan.         No follow-ups on file.   Mandy Second, PA

## 2023-08-15 ENCOUNTER — Ambulatory Visit (HOSPITAL_BASED_OUTPATIENT_CLINIC_OR_DEPARTMENT_OTHER)
Admission: RE | Admit: 2023-08-15 | Discharge: 2023-08-15 | Disposition: A | Discharge status: Home / Self Care | Source: Ambulatory Visit | Attending: Urgent Care | Admitting: Urgent Care

## 2023-08-15 ENCOUNTER — Encounter (HOSPITAL_BASED_OUTPATIENT_CLINIC_OR_DEPARTMENT_OTHER): Payer: Self-pay | Admitting: Radiology

## 2023-08-15 DIAGNOSIS — Z1231 Encounter for screening mammogram for malignant neoplasm of breast: Secondary | ICD-10-CM | POA: Insufficient documentation

## 2023-08-17 ENCOUNTER — Ambulatory Visit: Payer: Self-pay | Admitting: Urgent Care

## 2023-08-17 ENCOUNTER — Telehealth: Payer: Self-pay

## 2023-08-17 NOTE — Telephone Encounter (Signed)
 Copied from CRM [id number]. Topic: Clinical - Lab/Test Results >> Aug 17, 2023  3:15 PM Allyne Areola wrote: Reason for CRM: Patient is calling for her Mammography results.

## 2023-08-18 ENCOUNTER — Ambulatory Visit: Payer: Self-pay

## 2023-08-18 DIAGNOSIS — L0292 Furuncle, unspecified: Secondary | ICD-10-CM

## 2023-08-18 MED ORDER — SULFAMETHOXAZOLE-TRIMETHOPRIM 800-160 MG PO TABS: 1.0000 | ORAL_TABLET | Freq: Two times a day (BID) | ORAL | 0 refills | Status: DC

## 2023-08-18 MED ORDER — MUPIROCIN 2 % EX OINT: 1.0000 | TOPICAL_OINTMENT | Freq: Three times a day (TID) | CUTANEOUS | 0 refills | Status: DC

## 2023-08-18 NOTE — Telephone Encounter (Signed)
 FYI Only or Action Required?: Action required by provider: clinical question for provider.  Patient was last seen in primary care on 08/09/2023 by Mandy Second, PA. Called Nurse Triage reporting Skin Problem. Symptoms began yesterday. Interventions attempted: Rest, hydration, or home remedies. Symptoms are: unchanged.  Triage Disposition: See PCP When Office is Open (Within 3 Days)  Patient/caregiver understands and will follow disposition?: No, wishes to speak with PCP  Copied from CRM [id number]. Topic: Clinical - Red Word Triage >> Aug 18, 2023  1:35 PM Marlan Silva wrote: Red Word that prompted transfer to Nurse Triage: Patient called in stating she has a huge boil underneath her right arm. She said she had mrsa previously. She said the boil is inflamed and extremely painful. Reason for Disposition  Boil > 1/2 inch across (> 12 mm; larger than a marble)  Answer Assessment - Initial Assessment Questions 1. APPEARANCE of BOIL: What does the boil look like?      Inflamed and swollen-patient states she doesn't see a head 2. LOCATION: Where is the boil located?      Right arm 3. NUMBER: How many boils are there?      1 boil 4. SIZE: How big is the boil? (e.g., inches, cm; compare to size of a coin or other object)     Size of 2 dimes put together length wise 5. ONSET: When did the boil start?     Day ago 6. PAIN: Is there any pain? If Yes, ask: How bad is the pain?   (Scale 1-10; or mild, moderate, severe)     4 out of 10 7. FEVER: Do you have a fever? If Yes, ask: What is it, how was it measured, and when did it start?      no 8. SOURCE: Have you been around anyone with boils or other Staph infections? Have you ever had boils before?     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 OTHER SYMPTOMS: Do you have any other symptoms? (e.g., shaking chills, weakness, rash elsewhere on body)     no 10. PREGNANCY: Is there any chance you are pregnant? When was your last  menstrual period?       no  Protocols used: Boil (Skin Abscess)-A-AH

## 2023-08-18 NOTE — Telephone Encounter (Signed)
 fyi

## 2023-08-18 NOTE — Addendum Note (Signed)
 Addended by: Kaira Stringfield on: 08/18/2023 10:19 PM   Modules accepted: Orders

## 2023-08-19 ENCOUNTER — Other Ambulatory Visit: Payer: Self-pay | Admitting: Urgent Care

## 2023-08-19 DIAGNOSIS — L0292 Furuncle, unspecified: Secondary | ICD-10-CM

## 2023-08-19 MED ORDER — MUPIROCIN 2 % EX OINT: 1.0000 | TOPICAL_OINTMENT | Freq: Three times a day (TID) | CUTANEOUS | 0 refills | Status: DC

## 2023-08-19 MED ORDER — SULFAMETHOXAZOLE-TRIMETHOPRIM 800-160 MG PO TABS: 1.0000 | ORAL_TABLET | Freq: Two times a day (BID) | ORAL | 0 refills | Status: AC

## 2023-08-19 NOTE — Progress Notes (Signed)
 Rx called in last night was not actually e-prescribed due to technological error. New Rx e-prescribed today.

## 2023-09-01 ENCOUNTER — Other Ambulatory Visit: Payer: Self-pay

## 2023-09-01 DIAGNOSIS — F413 Other mixed anxiety disorders: Secondary | ICD-10-CM

## 2023-09-01 MED ORDER — DESVENLAFAXINE SUCCINATE ER 25 MG PO TB24: 25.0000 mg | ORAL_TABLET | Freq: Every day | ORAL | 2 refills | Status: AC

## 2023-09-07 ENCOUNTER — Telehealth: Payer: Self-pay

## 2023-09-07 NOTE — Telephone Encounter (Signed)
 Copied from CRM [id number]. Topic: Clinical - Medication Question >> Sep 07, 2023 10:18 AM [place] E wrote: Reason for CRM: Patient called in asking if it is okay to take both her busPIRone  (BUSPAR ) 15 MG tablet and desvenlafaxine  (PRISTIQ ) 25 MG 24 hr tablet at the same time. She was not taking her buspirone  due to being prescribed pristiq , but wants to know if it is okay to double up. Callback number [phone].

## 2023-09-14 NOTE — Telephone Encounter (Signed)
 Attempted call to patient. Left a voice mail message requesting a return call.

## 2023-12-01 ENCOUNTER — Encounter: Payer: Self-pay | Admitting: Urgent Care

## 2023-12-01 ENCOUNTER — Other Ambulatory Visit: Payer: Self-pay | Admitting: Urgent Care

## 2023-12-01 ENCOUNTER — Telehealth (INDEPENDENT_AMBULATORY_CARE_PROVIDER_SITE_OTHER): Admitting: Urgent Care

## 2023-12-01 VITALS — Wt 180.0 lb

## 2023-12-01 DIAGNOSIS — F419 Anxiety disorder, unspecified: Secondary | ICD-10-CM

## 2023-12-01 DIAGNOSIS — F32A Depression, unspecified: Secondary | ICD-10-CM

## 2023-12-01 DIAGNOSIS — R635 Abnormal weight gain: Secondary | ICD-10-CM | POA: Diagnosis not present

## 2023-12-01 DIAGNOSIS — N951 Menopausal and female climacteric states: Secondary | ICD-10-CM | POA: Diagnosis not present

## 2023-12-01 MED ORDER — BUSPIRONE HCL 15 MG PO TABS: 15.0000 mg | ORAL_TABLET | Freq: Two times a day (BID) | ORAL | 1 refills | Status: AC

## 2023-12-01 MED ORDER — LISDEXAMFETAMINE DIMESYLATE 10 MG PO CAPS: 10.0000 mg | ORAL_CAPSULE | Freq: Every day | ORAL | 0 refills | Status: DC

## 2023-12-01 NOTE — Progress Notes (Unsigned)
 Virtual Visit via Video Note  I connected with Alexandria Vincent on 12/02/23 at  1:40 PM EDT by a video enabled telemedicine application and verified that I am speaking with the correct person using two identifiers.  Patient Location: Other:  work Dispensing optician: Office/Clinic  I discussed the limitations, risks, security, and privacy concerns of performing an evaluation and management service by video and the availability of in person appointments. I also discussed with the patient that there may be a patient responsible charge related to this service. The patient expressed understanding and agreed to proceed.   Subjective  PCP: Lowella Benton CROME, PA  Chief Complaint  Patient presents with   Weight Check    And menopause symptoms    HPI:   Discussed the use of AI scribe software for clinical note transcription with the patient, who gave verbal consent to proceed.  History of Present Illness   Alexandria Vincent is a 43 year old female who presents with premenopausal symptoms and weight concerns.  She experiences significant mood changes, particularly in the evenings, describing them as a 'different level of irritable.' She often needs to walk away from her children and sometimes ends up crying. She is currently taking Buspar  in the morning and pristique, which she finds helpful in managing her mood during the day.  She reports nocturia, having to urinate two to three times per night. Her menstrual periods have become irregular, and she sometimes experiences menstrual cramps at night without being on her period. No hormone replacement therapy or birth control use.  She is concerned about her weight, noting that she has gained weight since COVID-19 and is now heavier than she was during pregnancy. Despite running five miles five days a week and maintaining a healthy diet, she has not been able to lose weight. She has cut out soda and alcohol and has tried various weight loss methods  without success. Her current weight is approximately 180-185 pounds, which is significantly higher than her previous weight of 140-150 pounds.       ROS: Per HPI. All other pertinent systems are negative.   Current Outpatient Medications:    ALPRAZolam  (XANAX ) 0.5 MG tablet, Take 1 tablet (0.5 mg total) by mouth 2 (two) times daily as needed for anxiety or sleep., Disp: 20 tablet, Rfl: 0   desvenlafaxine  (PRISTIQ ) 25 MG 24 hr tablet, Take 1 tablet (25 mg total) by mouth daily., Disp: 90 tablet, Rfl: 2   lisdexamfetamine (VYVANSE) 10 MG capsule, Take 1 capsule (10 mg total) by mouth daily before breakfast., Disp: 30 capsule, Rfl: 0   busPIRone  (BUSPAR ) 15 MG tablet, Take 1 tablet (15 mg total) by mouth 2 (two) times daily., Disp: 180 tablet, Rfl: 1    Objective  Vital signs not able to be obtained due to this being a virtual visit.  Physical Exam Vitals and nursing note reviewed.  Constitutional:      General: She is not in acute distress.    Appearance: Normal appearance. She is not ill-appearing, toxic-appearing or diaphoretic.  HENT:     Head: Normocephalic and atraumatic.  Eyes:     General: No scleral icterus.       Right eye: No discharge.        Left eye: No discharge.     Extraocular Movements: Extraocular movements intact.     Pupils: Pupils are equal, round, and reactive to light.  Pulmonary:     Effort: Pulmonary effort is normal. No respiratory distress.  Skin:    General: Skin is warm.     Coloration: Skin is not jaundiced.     Findings: No bruising or erythema.  Neurological:     General: No focal deficit present.     Mental Status: She is alert and oriented to person, place, and time.  Psychiatric:        Mood and Affect: Mood normal.        Behavior: Behavior normal.      Assessment & Plan Anxiety  Orders:   busPIRone  (BUSPAR ) 15 MG tablet; Take 1 tablet (15 mg total) by mouth 2 (two) times daily.   lisdexamfetamine (VYVANSE) 10 MG capsule; Take 1  capsule (10 mg total) by mouth daily before breakfast.  Weight gain  Orders:   lisdexamfetamine (VYVANSE) 10 MG capsule; Take 1 capsule (10 mg total) by mouth daily before breakfast.   Assessment and Plan    Perimenopausal symptoms Perimenopausal symptoms with mood changes, irritability, and nocturia. Irregular menstrual periods. Family history supports diagnosis. - Start black cohosh for symptom relief. - Consider menopausal complete supplement with vitamin E and evening primrose oil. - Discussed hormone replacement therapy; potential side effects include fluid retention and weight gain.  Depression and anxiety Depression and anxiety well-managed with Buspar . Evening irritability suggests need for second Buspar  dose. - Increase Buspar  to twice daily, with second dose at 4 PM. - Prescribe refill for increased dosing. - Monitor mood changes and effectiveness over 2-3 weeks.  Obesity Obesity with weight of 180-185 lbs despite lifestyle changes. Discussed Vyvanse for weight loss and symptom management. Potential side effects include increased blood pressure, heart rate, and anxiety. - Start Vyvanse at 10 mg daily, with potential increase to 20 mg or 40 mg if needed. - Monitor weight every 3-4 weeks to assess effectiveness. - Consider metabolic evaluation if no weight loss observed after dose adjustments.        No follow-ups on file.   I discussed the assessment and treatment plan with the patient. The patient was provided an opportunity to ask questions, and all were answered. The patient agreed with the plan and demonstrated an understanding of the instructions.   The patient was advised to call back or seek an in-person evaluation if the symptoms worsen or if the condition fails to improve as anticipated.  The above assessment and management plan was discussed with the patient. The patient verbalized understanding of and has agreed to the management plan.   Benton LITTIE Gave,  PA

## 2023-12-01 NOTE — Assessment & Plan Note (Signed)
  Orders:   busPIRone  (BUSPAR ) 15 MG tablet; Take 1 tablet (15 mg total) by mouth 2 (two) times daily.   lisdexamfetamine (VYVANSE) 10 MG capsule; Take 1 capsule (10 mg total) by mouth daily before breakfast.

## 2023-12-02 ENCOUNTER — Other Ambulatory Visit (HOSPITAL_COMMUNITY): Payer: Self-pay

## 2023-12-02 NOTE — Patient Instructions (Signed)
 Increase buspirone  to twice daily. Continue pristique. Add Vyvanse, taking once daily in the morning.  Consider over the counter black cohosh for menopausal symptoms. Notify me of results/ improvement in 3-4 weeks. Monitor weight at home no more than once weekly.

## 2023-12-06 ENCOUNTER — Encounter: Payer: Self-pay | Admitting: Urgent Care

## 2023-12-06 NOTE — Telephone Encounter (Signed)
 Per the patient - a prior authorization is required for the Lisdexamfetamine rx. Thanks in advance.

## 2023-12-07 ENCOUNTER — Other Ambulatory Visit (HOSPITAL_COMMUNITY): Payer: Self-pay

## 2023-12-07 ENCOUNTER — Telehealth: Payer: Self-pay

## 2023-12-07 NOTE — Telephone Encounter (Signed)
 Pharmacy Patient Advocate Encounter   Received notification from Patient Advice Request messages that prior authorization for  Lisdexamfetamine 10mg  caps is required/requested.   Insurance verification completed.   The patient is insured through [facility].   Per test claim: PA required; PA submitted to above mentioned insurance via Latent Key/confirmation #/EOC [id number] Status is pending

## 2023-12-07 NOTE — Telephone Encounter (Signed)
 The prior authorization for Lisdexamfetamine was denied by the insurance. The patient has been updated of the outcome via a MyChart message.

## 2023-12-07 NOTE — Telephone Encounter (Signed)
 Pharmacy Patient Advocate Encounter  Received notification from [facility] that Prior Authorization for Lisdexamfetamine 10mg  caps  has been DENIED.  Full denial letter will be uploaded to the media tab. See denial reason below.   PA #/Case ID/Reference #: [id number]

## 2023-12-26 ENCOUNTER — Encounter (INDEPENDENT_AMBULATORY_CARE_PROVIDER_SITE_OTHER): Payer: Self-pay | Admitting: Urgent Care

## 2023-12-26 DIAGNOSIS — R635 Abnormal weight gain: Secondary | ICD-10-CM

## 2023-12-26 MED ORDER — LISDEXAMFETAMINE DIMESYLATE 20 MG PO CAPS: 40.0000 mg | ORAL_CAPSULE | Freq: Every day | ORAL | 0 refills | Status: DC

## 2023-12-26 NOTE — Telephone Encounter (Signed)

## 2024-01-27 ENCOUNTER — Encounter: Payer: Self-pay | Admitting: Urgent Care

## 2024-01-27 DIAGNOSIS — R635 Abnormal weight gain: Secondary | ICD-10-CM

## 2024-01-30 MED ORDER — LISDEXAMFETAMINE DIMESYLATE 20 MG PO CAPS: 40.0000 mg | ORAL_CAPSULE | Freq: Every day | ORAL | 0 refills | Status: DC

## 2024-02-03 ENCOUNTER — Other Ambulatory Visit (HOSPITAL_COMMUNITY): Payer: Self-pay

## 2024-02-27 NOTE — Addendum Note (Signed)
 Addended by: BONNY JON DEL on: 02/27/2024 10:21 AM   Modules accepted: Orders

## 2024-02-29 NOTE — Telephone Encounter (Signed)
 PCP to address as I do not see diagnosis of ADD or ADHD on chart.

## 2024-03-05 MED ORDER — LISDEXAMFETAMINE DIMESYLATE 40 MG PO CAPS: 40.0000 mg | ORAL_CAPSULE | ORAL | 0 refills | Status: DC

## 2024-03-05 NOTE — Addendum Note (Signed)
 Addended by: Kiyaan Haq on: 03/05/2024 04:28 PM   Modules accepted: Orders

## 2024-03-31 ENCOUNTER — Encounter: Payer: Self-pay | Admitting: Urgent Care

## 2024-03-31 DIAGNOSIS — R635 Abnormal weight gain: Secondary | ICD-10-CM

## 2024-04-03 MED ORDER — LISDEXAMFETAMINE DIMESYLATE 40 MG PO CAPS: 40.0000 mg | ORAL_CAPSULE | ORAL | 0 refills | Status: AC

## 2024-04-04 MED ORDER — WEGOVY 1.5 MG PO TABS: 1.5000 mg | ORAL_TABLET | Freq: Every day | ORAL | 0 refills | Status: AC

## 2024-04-04 NOTE — Telephone Encounter (Signed)

## 2024-04-05 ENCOUNTER — Other Ambulatory Visit (HOSPITAL_COMMUNITY): Payer: Self-pay
